# Patient Record
Sex: Male | Born: 1970 | Race: Black or African American | Hispanic: No | Marital: Single | Smoking: Current every day smoker
Health system: Southern US, Community
[De-identification: ages and names within clinical notes are randomized; demographics above are authoritative.]

## PROBLEM LIST (undated history)

## (undated) ENCOUNTER — Emergency Department (HOSPITAL_COMMUNITY): Admission: EM | Payer: 59 | Source: Home / Self Care

## (undated) DIAGNOSIS — E785 Hyperlipidemia, unspecified: Secondary | ICD-10-CM

## (undated) DIAGNOSIS — I10 Essential (primary) hypertension: Secondary | ICD-10-CM

## (undated) HISTORY — DX: Essential (primary) hypertension: I10

---

## 1999-09-26 ENCOUNTER — Emergency Department (HOSPITAL_COMMUNITY): Admission: EM | Admit: 1999-09-26 | Discharge: 1999-09-26 | Payer: Self-pay | Admitting: Emergency Medicine

## 1999-12-10 ENCOUNTER — Emergency Department (HOSPITAL_COMMUNITY): Admission: EM | Admit: 1999-12-10 | Discharge: 1999-12-10 | Payer: Self-pay | Admitting: Emergency Medicine

## 2000-03-26 ENCOUNTER — Emergency Department (HOSPITAL_COMMUNITY): Admission: EM | Admit: 2000-03-26 | Discharge: 2000-03-26 | Payer: Self-pay | Admitting: Emergency Medicine

## 2000-08-11 ENCOUNTER — Emergency Department (HOSPITAL_COMMUNITY): Admission: EM | Admit: 2000-08-11 | Discharge: 2000-08-11 | Payer: Self-pay | Admitting: Emergency Medicine

## 2000-08-11 ENCOUNTER — Encounter: Payer: Self-pay | Admitting: Emergency Medicine

## 2001-03-14 ENCOUNTER — Emergency Department (HOSPITAL_COMMUNITY): Admission: EM | Admit: 2001-03-14 | Discharge: 2001-03-14 | Payer: Self-pay | Admitting: Emergency Medicine

## 2003-10-27 ENCOUNTER — Emergency Department (HOSPITAL_COMMUNITY): Admission: EM | Admit: 2003-10-27 | Discharge: 2003-10-27 | Payer: Self-pay | Admitting: Emergency Medicine

## 2005-05-24 ENCOUNTER — Emergency Department (HOSPITAL_COMMUNITY): Admission: EM | Admit: 2005-05-24 | Discharge: 2005-05-24 | Payer: Self-pay | Admitting: Emergency Medicine

## 2008-07-24 ENCOUNTER — Ambulatory Visit: Payer: Self-pay | Admitting: *Deleted

## 2008-07-24 ENCOUNTER — Inpatient Hospital Stay (HOSPITAL_COMMUNITY): Admission: RE | Admit: 2008-07-24 | Discharge: 2008-07-28 | Payer: Self-pay | Admitting: *Deleted

## 2008-10-06 ENCOUNTER — Emergency Department (HOSPITAL_COMMUNITY): Admission: EM | Admit: 2008-10-06 | Discharge: 2008-10-06 | Payer: Self-pay | Admitting: Emergency Medicine

## 2010-05-24 ENCOUNTER — Emergency Department (HOSPITAL_COMMUNITY): Admission: EM | Admit: 2010-05-24 | Discharge: 2010-05-24 | Payer: Self-pay | Admitting: Emergency Medicine

## 2010-05-25 ENCOUNTER — Emergency Department (HOSPITAL_COMMUNITY): Admission: EM | Admit: 2010-05-25 | Discharge: 2010-05-25 | Payer: Self-pay | Admitting: Emergency Medicine

## 2011-03-08 LAB — POCT I-STAT, CHEM 8
BUN: 8 mg/dL (ref 6–23)
Creatinine, Ser: 1 mg/dL (ref 0.4–1.5)
Glucose, Bld: 92 mg/dL (ref 70–99)
Hemoglobin: 16 g/dL (ref 13.0–17.0)
Potassium: 3.5 mEq/L (ref 3.5–5.1)
TCO2: 27 mmol/L (ref 0–100)

## 2011-03-08 LAB — CBC
Hemoglobin: 14.9 g/dL (ref 13.0–17.0)
MCHC: 34 g/dL (ref 30.0–36.0)
MCV: 94 fL (ref 78.0–100.0)
RDW: 15.8 % — ABNORMAL HIGH (ref 11.5–15.5)

## 2011-03-08 LAB — DIFFERENTIAL
Basophils Absolute: 0 10*3/uL (ref 0.0–0.1)
Basophils Relative: 0 % (ref 0–1)
Eosinophils Absolute: 0.3 10*3/uL (ref 0.0–0.7)
Eosinophils Relative: 2 % (ref 0–5)
Monocytes Absolute: 1 10*3/uL (ref 0.1–1.0)
Neutro Abs: 10.9 10*3/uL — ABNORMAL HIGH (ref 1.7–7.7)

## 2011-05-04 NOTE — Discharge Summary (Signed)
[name], BUCH NO.:  [id number]   MEDICAL RECORD NO.:  [id number]          PATIENT TYPE:  IPS   LOCATION:  0604                          FACILITY:  [facility]   PHYSICIAN:  Jasmine Pang, M.D. DATE OF BIRTH:  06-06-1971   DATE OF ADMISSION:  07/24/2008  DATE OF DISCHARGE:  07/27/2008                               DISCHARGE SUMMARY   IDENTIFICATION:  This is a 40 year old single African American male who  was admitted on a voluntary basis on July 24, 2008.   HISTORY OF PRESENT ILLNESS:  The patient was referred to access at  [facility] by his employer due to having  suicidal ideation with a plan in access.  The patient planned to inject  himself with radiator fluid.  His girlfriend was staying with him today.  So, he stated he was going to follow through tomorrow.  He has been  feeling depressed for a while.  He thinks no one loves him and he is  alone.  He has five kids and he has been having car trouble, so he  cannot see his kids as much.  His children are getting older and having  less to do with the patient because of their own outside activities.  The patient is cooperative, but irritable.   PAST PSYCHIATRIC HISTORY:  None.   SUBSTANCE ABUSE HISTORY:  The patient used THC a long time ago.  He  alluded to being involved in other drugs in the past, but states he is  not now.   PAST MEDICAL HISTORY:  No medical problems.   No known drug allergies.   No current medications.   PHYSICAL FINDINGS:  Complete physical exam was done in the ED prior to  admission.  There were no acute physical or medical problems noted.   HOSPITAL COURSE:  Upon admission, the patient was started on trazodone  50 mg p.o. q.h.s. p.r.n., may repeat in x1 if needed.  He was also  started on 21 mg nicotine patch as per smoking cessation protocol.  Due  to being somewhat hypokalemic, he was started on K-Dur 40 mEq x1.  The  patient was also started on  Depakote ER 500 mg p.o. daily.  The patient  was cooperative and friendly in individual sessions with me.  He also  participated appropriately in unit therapeutic groups and activities.  He denies alcohol or drug use now.  There has been a history of this.  He states he alternated between depression and anxiety and angry  episodes.  He has a lot of crying episodes.  He states he used to self  drugs years ago.  On July 26, 2008, the patient was hyperverbal with  some pressured speech.  However, his sleep was good and appetite was  good.  He was less depressed, less anxious.  He discussed his living  situation (being alone a lot).  He states he likes his work, but the  weekends are difficult for him when he is at home alone.  He talked  about his children, whom he is  attached to.  He described a lot of  conflict with his baby's mother.  I decided to increase the Depakote  dose to 500 mg p.o. b.i.d.  The patient tolerated these medications well  with no significant side effects.  On July 27, 2008, the patient's  mental status continued to improve.  He was less depressed, less  anxious.  He began to talk about going home on Sunday, July 28, 2008.  He was looking forward to this.  The patient was less hyperverbal with  no circumstantial thinking.  On July 28, 2008, mental status had  improved markedly from admission status.  The patient was less  depressed, less irritable, less anxious.  Affect was consistent with  mood.  There was no suicidal or homicidal ideation.  No thoughts of self-  injurious behavior.  No auditory or visual hallucinations.  No paranoia  or delusions.  Thoughts were logical and goal-directed.  Thought  content.  No predominant theme.  Cognitive was grossly intact.  Insight  good, judgment good.  Impulse level was normal, and it was felt the  patient was safe for discharge today.   DISCHARGE DIAGNOSES:  Axis I:  Mood disorder, not otherwise specified.  Features of  substance abuse, NOS in the past.  Axis II:  None.  Axis III:  None.  Axis IV:  Severe (problems with primary support group, financial  problems, worries about caring for his children and being able to see  them).  Axis V:  Global assessment of functioning was 50 upon discharge.  GAF  was 35 upon admission.  GAF highest past year was 70.   DISCHARGE PLAN:  There are no specific activity level or dietary  restrictions.   POSTHOSPITAL CARE PLANS:  The patient will go to the [facility] on  August 01, 2008 at 9:30 a.m.  He will also go to [facility] for  counseling.   DISCHARGE MEDICATIONS:  1. Depakote ER 500 mg b.i.d.  2. Trazodone 50 mg at bedtime if needed.      Jasmine Pang, M.D.  Electronically Signed     [facility]MED[facility]  D:  07/28/2008  T:  07/29/2008  Job:  [id number]

## 2011-06-24 ENCOUNTER — Emergency Department (HOSPITAL_COMMUNITY)
Admission: EM | Admit: 2011-06-24 | Discharge: 2011-06-24 | Disposition: A | Discharge status: Home / Self Care | Payer: Self-pay | Attending: Emergency Medicine | Admitting: Emergency Medicine

## 2011-06-24 DIAGNOSIS — R42 Dizziness and giddiness: Secondary | ICD-10-CM | POA: Insufficient documentation

## 2011-06-24 DIAGNOSIS — R51 Headache: Secondary | ICD-10-CM | POA: Insufficient documentation

## 2011-09-17 LAB — BASIC METABOLIC PANEL
BUN: 8
CO2: 27
Chloride: 105
Creatinine, Ser: 0.84
Potassium: 3.1 — ABNORMAL LOW

## 2011-09-17 LAB — URINE DRUGS OF ABUSE SCREEN W ALC, ROUTINE (REF LAB)
Amphetamine Screen, Ur: NEGATIVE
Barbiturate Quant, Ur: NEGATIVE
Benzodiazepines.: NEGATIVE
Ethyl Alcohol: <10
Methadone: NEGATIVE
Phencyclidine (PCP): NEGATIVE

## 2011-09-17 LAB — COMPREHENSIVE METABOLIC PANEL
ALT: 17
AST: 21
Albumin: 4.1
Alkaline Phosphatase: 59
Calcium: 9.8
GFR calc Af Amer: >60
Potassium: 2.9 — ABNORMAL LOW
Sodium: 141
Total Protein: 7.9

## 2011-09-17 LAB — URINALYSIS, ROUTINE W REFLEX MICROSCOPIC
Bilirubin Urine: NEGATIVE
Glucose, UA: NEGATIVE
Ketones, ur: NEGATIVE
Nitrite: NEGATIVE
Specific Gravity, Urine: 1.03
pH: 6.5

## 2011-09-17 LAB — HEPATIC FUNCTION PANEL
AST: 18
Albumin: 3.5
Total Bilirubin: 0.7
Total Protein: 7

## 2011-09-17 LAB — CBC
Hemoglobin: 14.7
MCHC: 33.7
MCHC: 34.1
Platelets: 201
Platelets: 219
RDW: 14.8
RDW: 15.8 — ABNORMAL HIGH

## 2011-09-17 LAB — DIFFERENTIAL
Basophils Absolute: 0.1
Basophils Relative: 1
Lymphocytes Relative: 44
Neutro Abs: 3
Neutrophils Relative %: 40 — ABNORMAL LOW

## 2011-09-17 LAB — URINE MICROSCOPIC-ADD ON

## 2011-09-17 LAB — VALPROIC ACID LEVEL: Valproic Acid Lvl: 59.8

## 2011-09-17 LAB — THC (MARIJUANA), URINE, CONFIRMATION: Marijuana, Ur-Confirmation: 150 ng/mL

## 2014-01-29 ENCOUNTER — Encounter (HOSPITAL_COMMUNITY): Payer: Self-pay | Admitting: Emergency Medicine

## 2014-01-29 ENCOUNTER — Emergency Department (HOSPITAL_COMMUNITY): Payer: 59

## 2014-01-29 ENCOUNTER — Emergency Department (HOSPITAL_COMMUNITY)
Admission: EM | Admit: 2014-01-29 | Discharge: 2014-01-29 | Disposition: A | Discharge status: Home / Self Care | Payer: 59 | Attending: Emergency Medicine | Admitting: Emergency Medicine

## 2014-01-29 DIAGNOSIS — Z87828 Personal history of other (healed) physical injury and trauma: Secondary | ICD-10-CM | POA: Insufficient documentation

## 2014-01-29 DIAGNOSIS — R61 Generalized hyperhidrosis: Secondary | ICD-10-CM | POA: Insufficient documentation

## 2014-01-29 DIAGNOSIS — R0602 Shortness of breath: Secondary | ICD-10-CM | POA: Insufficient documentation

## 2014-01-29 DIAGNOSIS — H81399 Other peripheral vertigo, unspecified ear: Secondary | ICD-10-CM | POA: Insufficient documentation

## 2014-01-29 DIAGNOSIS — F17200 Nicotine dependence, unspecified, uncomplicated: Secondary | ICD-10-CM | POA: Insufficient documentation

## 2014-01-29 DIAGNOSIS — R079 Chest pain, unspecified: Secondary | ICD-10-CM | POA: Insufficient documentation

## 2014-01-29 LAB — BASIC METABOLIC PANEL
BUN: 11 mg/dL (ref 6–23)
CALCIUM: 9.7 mg/dL (ref 8.4–10.5)
CO2: 25 mEq/L (ref 19–32)
Chloride: 102 mEq/L (ref 96–112)
Creatinine, Ser: 0.82 mg/dL (ref 0.50–1.35)
Glucose, Bld: 89 mg/dL (ref 70–99)
POTASSIUM: 4 meq/L (ref 3.7–5.3)
SODIUM: 140 meq/L (ref 137–147)

## 2014-01-29 LAB — CBC
HCT: 42.8 % (ref 39.0–52.0)
Hemoglobin: 15 g/dL (ref 13.0–17.0)
MCH: 31.9 pg (ref 26.0–34.0)
MCHC: 35 g/dL (ref 30.0–36.0)
MCV: 91.1 fL (ref 78.0–100.0)
Platelets: 204 10*3/uL (ref 150–400)
RBC: 4.7 MIL/uL (ref 4.22–5.81)
RDW: 15.9 % — AB (ref 11.5–15.5)
WBC: 11.9 10*3/uL — ABNORMAL HIGH (ref 4.0–10.5)

## 2014-01-29 LAB — POCT I-STAT TROPONIN I: Troponin i, poc: 0 ng/mL (ref 0.00–0.08)

## 2014-01-29 MED ORDER — SODIUM CHLORIDE 0.9 % IV BOLUS (SEPSIS)
1000.0000 mL | Freq: Once | INTRAVENOUS | Status: AC
  Administered 2014-01-29: 1000 mL via INTRAVENOUS

## 2014-01-29 MED ORDER — MECLIZINE HCL 50 MG PO TABS: 50.0000 mg | ORAL_TABLET | Freq: Three times a day (TID) | ORAL | Status: DC | PRN

## 2014-01-29 MED ORDER — PROMETHAZINE HCL 25 MG PO TABS: 25.0000 mg | ORAL_TABLET | Freq: Four times a day (QID) | ORAL | Status: DC | PRN
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
## 2014-01-29 NOTE — Discharge Instructions (Signed)
Chest Pain (Nonspecific) °It is often hard to give a specific diagnosis for the cause of chest pain. There is always a chance that your pain could be related to something serious, such as a heart attack or a blood clot in the lungs. You need to follow up with your caregiver for further evaluation. °CAUSES  °· Heartburn. °· Pneumonia or bronchitis. °· Anxiety or stress. °· Inflammation around your heart (pericarditis) or lung (pleuritis or pleurisy). °· A blood clot in the lung. °· A collapsed lung (pneumothorax). It can develop suddenly on its own (spontaneous pneumothorax) or from injury (trauma) to the chest. °· Shingles infection (herpes zoster virus). °The chest wall is composed of bones, muscles, and cartilage. Any of these can be the source of the pain. °· The bones can be bruised by injury. °· The muscles or cartilage can be strained by coughing or overwork. °· The cartilage can be affected by inflammation and become sore (costochondritis). °DIAGNOSIS  °Lab tests or other studies, such as X-rays, electrocardiography, stress testing, or cardiac imaging, may be needed to find the cause of your pain.  °TREATMENT  °· Treatment depends on what may be causing your chest pain. Treatment may include: °· Acid blockers for heartburn. °· Anti-inflammatory medicine. °· Pain medicine for inflammatory conditions. °· Antibiotics if an infection is present. °· You may be advised to change lifestyle habits. This includes stopping smoking and avoiding alcohol, caffeine, and chocolate. °· You may be advised to keep your head raised (elevated) when sleeping. This reduces the chance of acid going backward from your stomach into your esophagus. °· Most of the time, nonspecific chest pain will improve within 2 to 3 days with rest and mild pain medicine. °HOME CARE INSTRUCTIONS  °· If antibiotics were prescribed, take your antibiotics as directed. Finish them even if you start to feel better. °· For the next few days, avoid physical  activities that bring on chest pain. Continue physical activities as directed. °· Do not smoke. °· Avoid drinking alcohol. °· Only take over-the-counter or prescription medicine for pain, discomfort, or fever as directed by your caregiver. °· Follow your caregiver's suggestions for further testing if your chest pain does not go away. °· Keep any follow-up appointments you made. If you do not go to an appointment, you could develop lasting (chronic) problems with pain. If there is any problem keeping an appointment, you must call to reschedule. °SEEK MEDICAL CARE IF:  °· You think you are having problems from the medicine you are taking. Read your medicine instructions carefully. °· Your chest pain does not go away, even after treatment. °· You develop a rash with blisters on your chest. °SEEK IMMEDIATE MEDICAL CARE IF:  °· You have increased chest pain or pain that spreads to your arm, neck, jaw, back, or abdomen. °· You develop shortness of breath, an increasing cough, or you are coughing up blood. °· You have severe back or abdominal pain, feel nauseous, or vomit. °· You develop severe weakness, fainting, or chills. °· You have a fever. °THIS IS AN EMERGENCY. Do not wait to see if the pain will go away. Get medical help at once. Call your local emergency services (911 in [place] Do not drive yourself to the hospital. °MAKE SURE YOU:  °· Understand these instructions. °· Will watch your condition. °· Will get help right away if you are not doing well or get worse. °Document Released: 09/15/2005 Document Revised: 02/28/2012 Document Reviewed: 07/11/2008 °ExitCare® Patient Information ©2014 ExitCare,   LLC.  Benign Positional Vertigo Vertigo means you feel like you or your surroundings are moving when they are not. Benign positional vertigo is the most common form of vertigo. Benign means that the cause of your condition is not serious. Benign positional vertigo is more common in older adults. CAUSES  Benign  positional vertigo is the result of an upset in the labyrinth system. This is an area in the middle ear that helps control your balance. This may be caused by a viral infection, head injury, or repetitive motion. However, often no specific cause is found. SYMPTOMS  Symptoms of benign positional vertigo occur when you move your head or eyes in different directions. Some of the symptoms may include:  Loss of balance and falls.  Vomiting.  Blurred vision.  Dizziness.  Nausea.  Involuntary eye movements (nystagmus). DIAGNOSIS  Benign positional vertigo is usually diagnosed by physical exam. If the specific cause of your benign positional vertigo is unknown, your caregiver may perform imaging tests, such as magnetic resonance imaging (MRI) or computed tomography (CT). TREATMENT  Your caregiver may recommend movements or procedures to correct the benign positional vertigo. Medicines such as meclizine, benzodiazepines, and medicines for nausea may be used to treat your symptoms. In rare cases, if your symptoms are caused by certain conditions that affect the inner ear, you may need surgery. HOME CARE INSTRUCTIONS   Follow your caregiver's instructions.  Move slowly. Do not make sudden body or head movements.  Avoid driving.  Avoid operating heavy machinery.  Avoid performing any tasks that would be dangerous to you or others during a vertigo episode.  Drink enough fluids to keep your urine clear or pale yellow. SEEK IMMEDIATE MEDICAL CARE IF:   You develop problems with walking, weakness, numbness, or using your arms, hands, or legs.  You have difficulty speaking.  You develop severe headaches.  Your nausea or vomiting continues or gets worse.  You develop visual changes.  Your family or friends notice any behavioral changes.  Your condition gets worse.  You have a fever.  You develop a stiff neck or sensitivity to light. MAKE SURE YOU:   Understand these  instructions.  Will watch your condition.  Will get help right away if you are not doing well or get worse. Document Released: 09/13/2006 Document Revised: 02/28/2012 Document Reviewed: 08/26/2011 [facility] Patient Information 2014 [place], [place].    Emergency Department Resource Guide 1) Find a Doctor and Pay Out of Pocket Although you won't have to find out who is covered by your insurance plan, it is a good idea to ask around and get recommendations. You will then need to call the office and see if the doctor you have chosen will accept you as a new patient and what types of options they offer for patients who are self-pay. Some doctors offer discounts or will set up payment plans for their patients who do not have insurance, but you will need to ask so you aren't surprised when you get to your appointment.  2) Contact Your Local Health Department Not all health departments have doctors that can see patients for sick visits, but many do, so it is worth a call to see if yours does. If you don't know where your local health department is, you can check in your phone book. The CDC also has a tool to help you locate your state's health department, and many state websites also have listings of all of their local health departments.  3) Find a  Walk-in Clinic If your illness is not likely to be very severe or complicated, you may want to try a walk in clinic. These are popping up all over the country in pharmacies, drugstores, and shopping centers. They're usually staffed by nurse practitioners or physician assistants that have been trained to treat common illnesses and complaints. They're usually fairly quick and inexpensive. However, if you have serious medical issues or chronic medical problems, these are probably not your best option.  No Primary Care Doctor: - Call Health Connect at  [phone] - they can help you locate a primary care doctor that  accepts your insurance, provides certain  services, etc. - Physician Referral Service- [phone]  Chronic Pain Problems: Organization         Address  Phone   Notes  [facility]  [phone] Patients need to be referred by their primary care doctor.   Medication Assistance: Organization         Address  Phone   Notes  [facility] Medication [facility] [address] [place] [place], [place] [address] [phone] --Must be a resident of [facility] -- Must have NO insurance coverage whatsoever (no Medicaid/ Medicare, etc.) -- The pt. MUST have a primary care doctor that directs their care regularly and follows them in the community   MedAssist  [phone]   [employer]  [phone]    Agencies that provide inexpensive medical care: Organization         Address  Phone   Notes  [facility]  [phone]   [facility]    [phone]   [facility] [address] [place], [place] [address] [phone]   [facility] [address] [place] [place] [phone]   Planned Parenthood    [phone]   [facility]    [phone]   [facility]  [address], [place] Phone:  [phone], Fax:  [phone] Hours of Operation:  9 am - 6 pm, M-F.  Also accepts Medicaid/Medicare and self-pay.  [facility]  [address], [place] Phone: [phone], Fax: [phone]. Hours of Operation:  8:30 am - 5:30 pm, M-F.  Also accepts Medicaid and self-pay.  [facility] [place] [address] [place] Phone: [phone]   [facility] [address] Natasha Bence [place], [place] [phone], Ext. [phone] Mondays & Thursdays: 7-9 AM.  First 15 patients are seen on a first come, first serve basis.    Medicaid-accepting [facility] Providers:  Organization         Address  Phone   Notes  [facility] [address],  [phone] Also accepts self-pay patients.  [facility] [address] Laurell Josephs [place], [place]  [phone]   [facility] [address], [place] [phone]   [facility] [address] [place] [phone]   Renaye Rakers [address], [place]   [phone] Only accepts [place] Access [place] patients after they have their name applied to their card.   Self-Pay (no insurance) in [facility]:  Organization         Address  Phone   Notes  Sickle Cell Patients, [facility] [address] [place],  [place] [phone]   [facility] [address] [place], [place] [phone]   [facility] [place]  [address], [place] [phone]   [facility]/Dr. Osei-Bonsu  [address], [place] or [address], [place] [phone] Phone number for both [place] and [place] locations is the same.  [facility] [address] [place] [phone]   [facility] [address], [place] or [address] [phone]   [facility] [address], [place] [phone], phone; [phone], fax Sees patients 1st and 3rd Saturday of every month.  Must not qualify for public or private insurance (i.e. Medicaid, Medicare, [place] Health Choice, Veterans' Benefits)  Household income should be no more than 200% of the poverty level The clinic cannot treat you if you are pregnant or think you are pregnant  Sexually transmitted diseases are not treated at the clinic.    Dental Care: Organization         Address  Phone  Notes  [facility] [address] [place], [place] [phone] Accepts  children up to age 52 who are enrolled in [place] or [place] Health Choice; pregnant women with a Medicaid card; and children who have applied for Medicaid or [place] Health Choice, but were declined, whose parents can pay a reduced fee at time of service.  [facility]  [address], [place] [phone] Accepts children up to age 36 who are enrolled in [place] or [place] Health Choice; pregnant women with a Medicaid card; and children who have applied for Medicaid or [place] Health Choice, but were declined, whose parents can pay a reduced fee at time of service.  [facility]  [address] [place], [place] [phone] Patients are seen by appointment only. Walk-ins are not accepted. [facility] will see patients 37 years of age and older. Monday - Tuesday (8am-5pm) Most Wednesdays (8:30-5pm) $30 per visit, cash only  [facility] Adult Dental Access PROGRAM  [address], [facility] [phone] Patients are seen by appointment only. Walk-ins are not accepted. [facility] will see patients 78 years of age and older. One Wednesday Evening (Monthly: Volunteer Based).  $30 per visit, cash only  [employer]  [phone] for adults; Children under age 14, call Graduate Pediatric Dentistry at [phone]. Children aged 75-14, please call [phone] to request a pediatric application.  Dental services are provided in all areas of dental care including fillings, crowns and bridges, complete and partial dentures, implants, gum treatment, root canals, and extractions. Preventive care is also provided. Treatment is provided to both adults and children. Patients are selected via a lottery and there is often a waiting list.   [facility] [address] [place]  [phone] [email or web address]   [facility] [address] [employer], [place] [phone], Ext. [phone] Second and  Fourth Thursday of each month, opens at 6:30 AM; Clinic ends at 9 AM.  Patients are seen on a first-come first-served basis, and a limited number are seen during each clinic.   [facility]  [address] Ether Griffins [place], [place] [phone]   Eligibility Requirements You must have lived in [place], [place], or [place]  counties for at least the last three months.   You cannot be eligible for state or federal sponsored [place], including [employer], [place], or [employer].   You generally cannot be eligible for healthcare insurance through your employer.    How to apply: Eligibility screenings are held every Tuesday and Wednesday afternoon from 1:00 pm until 4:00 pm. You do not need an appointment for the interview!  [facility] [address], [place], [place] [phone]   [facility] Health Department  [phone]   [facility] Health Department  [phone]   [facility] Health Department  [phone]    Behavioral Health Resources in the Community: Intensive Outpatient Programs Organization         Address  Phone  Notes  [facility] [address], [place], [place] [phone]   [facility] [address], [place], [place] [phone]   [facility] [address] [place], [place]  [phone]   [facility] [address],  [place], [place] [phone] or [phone]   Substance Abuse Resources Organization         Address  Phone  Notes  [facility] Drug Services  [phone]   [facility]  [phone]   [facility] [place]  [phone]   Floydene Flock  [phone]   Residential & Outpatient Substance Abuse Program  [phone]   Psychological Services Organization         Address  Phone  Notes  [facility] Behavioral Health  [phone]   [facility] Services  [phone]   [facility] Mental Health  [address] [place] [phone] or [phone]    Mobile Crisis Teams Organization         Address  Phone  Notes  [facility], Mobile Crisis Care Unit  [phone]   [facility]  [address] [place], [place] [phone]   Doristine Locks [address] [place] [phone]    Self-Help/Support Groups Organization         Address  Phone             Notes  [facility] [place] - variety of support groups  336- I7437963 Call for more information  Narcotics Anonymous (NA), [facility] [address], [employer] [place]  2 meetings at this location   Statistician         Address  Phone  Notes  [facility] [address],    [place]  [phone]   [facility] [place] [facility]  [address], [place] [address], [place], [place] [phone]   [facility] [address] [place], [place] [phone] Admissions: 8am-3pm M-F  [facility] [address],    [place], [place] [phone]   [facility] [address] [place], [place], [place] [phone]   [facility] [address]  [place], [place] [phone]   [facility] - Intensive Outpatient [address] Laurell Joseph[address], [place], [place] [phone]   [facility] ([facility]iction [facility] [address] [place]  [place], [place] [phone] or [phone]   [facility] ([facility]) [address] [place], [place] [phone] Accepts Medicaid  [facility] [address],  [place] [phone] Substance Abuse/Addiction Treatment   [facility] Resources Organization         Address  Phone  Notes  Estate manager/land agent Services  (  [phone]) [phone]   Angie FavaJulie Brannon, PhD [address], [address] [place], [place]   [phone]) [phone] or [phone]) [phone]   [facility]   [address] [place], [place] [phone])  [phone]   [facility] [address], [place], [place] [phone]) [phone] Insurance/Medicaid/sponsorship through [facility]  [facility] [address]                                    [place], [place] [phone]) [phone] Therapy/tele-psych/case  [facility] [address]   [place], [place] [phone]) [phone]    Dr. Lolly MustacheArfeen  [phone]   [facility] [place] [facility]  [employer] [facility] 1) [address], [place] 2) [address], [place] 3)  [address], [place] [phone]) [phone]) [phone]  [phone]) [phone]   [facility] Child Abuse Hotline [phone](336) 2390647985 or [phone](336) [phone] (After Hours)

## 2014-01-29 NOTE — Progress Notes (Signed)
   CARE MANAGEMENT ED NOTE 01/29/2014  [facility]  [name]   Account Number:  [id number]  Date Initiated:  01/29/2014  Documentation initiated by:  Radford PaxFERRERO,Syrita Dovel  Subjective/Objective Assessment:   Patient presents to Ed with intermittent dizziness x 2.5 weeks.     Subjective/Objective Assessment Detail:     Action/Plan:   Action/Plan Detail:   Anticipated DC Date:       Status Recommendation to Physician:   Result of Recommendation:    Other ED Services  Consult Working Plan    DC Planning Services  Other  PCP issues    Choice offered to / List presented to:            Status of service:  Completed, signed off  ED Comments:   ED Comments Detail:  Patient confirms he does not have a pcp.  EDCM instructed patient to call the phone number on the back of his insurance card or go to insurance company website to help him find a physician who is close to him and within network.  Patient verbalized understanding.  no further EDCM needs at this time.

## 2015-09-16 ENCOUNTER — Emergency Department (HOSPITAL_COMMUNITY)
Admission: EM | Admit: 2015-09-16 | Discharge: 2015-09-16 | Disposition: A | Discharge status: Home / Self Care | Payer: 59 | Attending: Emergency Medicine | Admitting: Emergency Medicine

## 2015-09-16 ENCOUNTER — Encounter (HOSPITAL_COMMUNITY): Payer: Self-pay | Admitting: Emergency Medicine

## 2015-09-16 DIAGNOSIS — I10 Essential (primary) hypertension: Secondary | ICD-10-CM | POA: Insufficient documentation

## 2015-09-16 DIAGNOSIS — Z720 Tobacco use: Secondary | ICD-10-CM | POA: Insufficient documentation

## 2015-09-16 DIAGNOSIS — K088 Other specified disorders of teeth and supporting structures: Secondary | ICD-10-CM | POA: Diagnosis present

## 2015-09-16 DIAGNOSIS — K047 Periapical abscess without sinus: Secondary | ICD-10-CM | POA: Diagnosis not present

## 2015-09-16 MED ORDER — PENICILLIN V POTASSIUM 500 MG PO TABS
500.0000 mg | ORAL_TABLET | Freq: Once | ORAL | Status: AC
  Administered 2015-09-16: 500 mg via ORAL
  Filled 2015-09-16: qty 1

## 2015-09-16 MED ORDER — IBUPROFEN 800 MG PO TABS
800.0000 mg | ORAL_TABLET | Freq: Once | ORAL | Status: AC
  Administered 2015-09-16: 800 mg via ORAL
  Filled 2015-09-16: qty 1

## 2015-09-16 MED ORDER — IBUPROFEN 800 MG PO TABS: 800.0000 mg | ORAL_TABLET | Freq: Three times a day (TID) | ORAL | Status: DC

## 2015-09-16 MED ORDER — PENICILLIN V POTASSIUM 500 MG PO TABS: 500.0000 mg | ORAL_TABLET | Freq: Four times a day (QID) | ORAL | Status: AC

## 2015-09-16 NOTE — ED Notes (Signed)
PA, Patel back in to speak to pt.  RN again encouraged pt to utilize list of providers given today.  Pt verbalized understanding.

## 2015-09-16 NOTE — ED Notes (Signed)
Painful lump in tissue of front gum line. Pain and swelling noted this am

## 2015-09-16 NOTE — ED Provider Notes (Signed)
CSN: [id number]     Arrival date & time 09/16/15  1301 History  By signing my name below, I, [name], attest that this documentation has been prepared under the direction and in the presence of [employer], PA-C. Electronically Signed: Ronney Allison, ED Scribe. 09/16/2015. 1:31 PM.    Chief Complaint  Patient presents with  . Dental Pain    pain in front gum   The history is provided by the patient. No language interpreter was used.    HPI Comments: [name] is a 44 y.o. male who presents to the Emergency Department complaining of constant, throbbing, severe, front lower gingival pain radiating to his head. He states he first noticed a painful bump on the interior gumline behind his front lower teeth this morning. Patient reports he has not taken any medications or tried any treatments for this.  He denies fever, chills, or facial swelling.   History reviewed. No pertinent past medical history. History reviewed. No pertinent past surgical history. Family History  Problem Relation Age of Onset  . Cancer Mother   . Heart failure Father    Social History  Substance Use Topics  . Smoking status: Current Every Day Smoker -- 0.50 packs/day    Types: Cigarettes  . Smokeless tobacco: None  . Alcohol Use: No    Review of Systems  Constitutional: Negative for fever and chills.  HENT: Positive for dental problem. Negative for facial swelling.   Neurological: Positive for headaches (radiating from dental pain).    Allergies  Review of patient's allergies indicates no known allergies.  Home Medications   Prior to Admission medications   Medication Sig Start Date End Date Taking? Authorizing Provider  ibuprofen (ADVIL,MOTRIN) 800 MG tablet Take 1 tablet (800 mg total) by mouth 3 (three) times daily. 09/16/15   Hanna Patel-Mills, PA-C  meclizine (ANTIVERT) 50 MG tablet Take 1 tablet (50 mg total) by mouth 3 (three) times daily as needed for dizziness. 01/29/14   Kristen N Ward, DO   penicillin v potassium (VEETID) 500 MG tablet Take 1 tablet (500 mg total) by mouth 4 (four) times daily. 09/16/15 09/23/15  Hanna Patel-Mills, PA-C  promethazine (PHENERGAN) 25 MG tablet Take 1 tablet (25 mg total) by mouth every 6 (six) hours as needed for nausea or vomiting. 01/29/14   Kristen N Ward, DO   BP 174/85 mmHg  Pulse 70  Temp(Src) 99.6 F (37.6 C) (Oral)  Resp 18  SpO2 100% Physical Exam  Constitutional: He is oriented to person, place, and time. He appears well-developed and well-nourished. No distress.  HENT:  Head: Normocephalic and atraumatic.  He has a dental abscess below the front lower teeth, along the interior gumline, without active drainage. He has plaque buildup, multiple dental caries, and poor dentition throughout. No facial swelling or tongue swelling. No drooling or trismus. Patent airway.   Eyes: Conjunctivae and EOM are normal.  Neck: Neck supple. No tracheal deviation present.  No anterior cervical swelling.   Cardiovascular: Normal rate.   Pulmonary/Chest: Effort normal. No respiratory distress.  Musculoskeletal: Normal range of motion.  Lymphadenopathy:    He has no cervical adenopathy.  Neurological: He is alert and oriented to person, place, and time.  Skin: Skin is warm and dry.  Psychiatric: He has a normal mood and affect. His behavior is normal.  Nursing note and vitals reviewed.   ED Course  Procedures (including critical care time)  DIAGNOSTIC STUDIES: Oxygen Saturation is 97% on RA, normal by my  interpretation.    COORDINATION OF CARE: 1:26 PM - Discussed treatment plan with pt at bedside which includes aspiration of abscess and Rx antibiotics. Pt verbalized understanding and agreed to plan.   ASPIRATION PROCEDURE NOTE: Patient identification was confirmed and verbal consent was obtained. This procedure was performed by Catha Gosselin, PA-C, at 1:43 PM. Site: Interior gumline along front lower teeth Sterile procedures  observed Needle size: 27 Drainage: No drainage was able to be aspirated. Pt tolerated procedure well without complications.  Instructions for care discussed verbally and pt provided with additional written instructions for homecare and f/u.   MDM   Final diagnoses:  Dental abscess  Essential hypertension  Patient presents for dental abscess. He is afebrile and vitals are stable. I attempted to aspirate the abscess but did not obtain anything. He does not appear to have Ludwig's angina or deep tissue infection. I discussed return precautions with the patient. I gave him several dental referrals. Patient is hypertensive upon discharge but states his pain is only 2/10 now. I discussed following up with a primary care provider using the resource guide and he verbally agrees with the plan I also gave him return precautions. Medications  penicillin v potassium (VEETID) tablet 500 mg (500 mg Oral Given 09/16/15 1424)  ibuprofen (ADVIL,MOTRIN) tablet 800 mg (800 mg Oral Given 09/16/15 1424)  Rx: PCN V I personally performed the services described in this documentation, which was scribed in my presence. The recorded information has been reviewed and is accurate.     Catha Gosselin, PA-C 09/16/15 1631  Pricilla Loveless, MD 09/18/15 [phone]

## 2015-09-16 NOTE — Discharge Instructions (Signed)
Dental Abscess Follow-up with a dentist from the referrals that I have given you today or use the resource guide below. Return for fever, increased gum swelling, tongue swelling, or difficulty breathing. A dental abscess is a collection of infected fluid (pus) from a bacterial infection in the inner part of the tooth (pulp). It usually occurs at the end of the tooth's root.  CAUSES   Severe tooth decay.  Trauma to the tooth that allows bacteria to enter into the pulp, such as a broken or chipped tooth. SYMPTOMS   Severe pain in and around the infected tooth.  Swelling and redness around the abscessed tooth or in the mouth or face.  Tenderness.  Pus drainage.  Bad breath.  Bitter taste in the mouth.  Difficulty swallowing.  Difficulty opening the mouth.  Nausea.  Vomiting.  Chills.  Swollen neck glands. DIAGNOSIS   A medical and dental history will be taken.  An examination will be performed by tapping on the abscessed tooth.  X-rays may be taken of the tooth to identify the abscess. TREATMENT The goal of treatment is to eliminate the infection. You may be prescribed antibiotic medicine to stop the infection from spreading. A root canal may be performed to save the tooth. If the tooth cannot be saved, it may be pulled (extracted) and the abscess may be drained.  HOME CARE INSTRUCTIONS  Only take over-the-counter or prescription medicines for pain, fever, or discomfort as directed by your caregiver.  Rinse your mouth (gargle) often with salt water ( tsp salt in 8 oz [250 ml] of warm water) to relieve pain or swelling.  Do not drive after taking pain medicine (narcotics).  Do not apply heat to the outside of your face.  Return to your dentist for further treatment as directed. SEEK MEDICAL CARE IF:  Your pain is not helped by medicine.  Your pain is getting worse instead of better. SEEK IMMEDIATE MEDICAL CARE IF:  You have a fever or persistent symptoms for  more than 2-3 days.  You have a fever and your symptoms suddenly get worse.  You have chills or a very bad headache.  You have problems breathing or swallowing.  You have trouble opening your mouth.  You have swelling in the neck or around the eye. Document Released: 12/06/2005 Document Revised: 08/30/2012 Document Reviewed: 03/16/2011 [facility] Patient Information 2015 [place], [place]. This information is not intended to replace advice given to you by your health care provider. Make sure you discuss any questions you have with your health care provider.  Emergency Department Resource Guide 1) Find a Doctor and Pay Out of Pocket Although you won't have to find out who is covered by your insurance plan, it is a good idea to ask around and get recommendations. You will then need to call the office and see if the doctor you have chosen will accept you as a new patient and what types of options they offer for patients who are self-pay. Some doctors offer discounts or will set up payment plans for their patients who do not have insurance, but you will need to ask so you aren't surprised when you get to your appointment.  2) Contact Your Local Health Department Not all health departments have doctors that can see patients for sick visits, but many do, so it is worth a call to see if yours does. If you don't know where your local health department is, you can check in your phone book. The CDC also has  a tool to help you locate your state's health department, and many state websites also have listings of all of their local health departments.  3) Find a Walk-in Clinic If your illness is not likely to be very severe or complicated, you may want to try a walk in clinic. These are popping up all over the country in pharmacies, drugstores, and shopping centers. They're usually staffed by nurse practitioners or physician assistants that have been trained to treat common illnesses and complaints. They're  usually fairly quick and inexpensive. However, if you have serious medical issues or chronic medical problems, these are probably not your best option.  No Primary Care Doctor: - Call Health Connect at  [phone] - they can help you locate a primary care doctor that  accepts your insurance, provides certain services, etc. - Physician Referral Service- [phone]  Chronic Pain Problems: Organization         Address  Phone   Notes  [facility]  [phone] Patients need to be referred by their primary care doctor.   Medication Assistance: Organization         Address  Phone   Notes  [facility] Medication [facility] [address] [place], [place] [address] [phone] --Must be a resident of [facility] -- Must have NO insurance coverage whatsoever (no Medicaid/ Medicare, etc.) -- The pt. MUST have a primary care doctor that directs their care regularly and follows them in the community   Med[facility]ist  [phone]   [employer]  [phone]    Agencies that provide inexpensive medical care: Organization         Address  Phone   Notes  [facility]  [phone]   [facility]    [phone]   [facility] [address] [place], [place] [address] [phone]   [facility] [address] [place], [place] [phone]   [facility]anned Parenthood    [phone]   [facility]    [phone]   [facility]  [address], [place] Phone:  [phone], Fax:  [phone] Hours of Operation:  9 am - 6 pm, M-F.  Also accepts Medicaid/Medicare and self-pay.  [facility]  [address], [place] Phone: [phone], Fax: [phone]. Hours of Operation:  8:30 am - 5:30 pm, M-F.  Also accepts Medicaid and self-pay.  [facility] [place] [address], [place] Phone:  [phone]   [facility] [address] Natasha Bence [place], [place] [phone], Ext. [phone] Mondays & Thursdays: 7-9 AM.  First 15 patients are seen on a first come, first serve basis.    Medicaid-accepting [facility] Providers:  Organization         Address  Phone   Notes  [facility] [address], [place] [phone] Also accepts self-pay patients.  [facility] [address] Laurell Josephs [place], [place]  [phone]   [facility] [address], [place] [phone]   [facility] [address] [place] [phone]   Renaye Rakers [address], [place]   [phone] Only accepts [place] Access [place] patients after they have their name applied to their card.   Self-Pay (no insurance) in [place]:  Organization         Address  Phone   Notes  Sickle Cell Patients, [facility] [address] [place], [place] [phone]   [facility] [address] [place], [place] [phone]   [facility] [place]  [address], [place] [phone]   [facility]/Dr. Osei-Bonsu  [address], [place] or [address], [place] [phone] Phone number for both [place] and [place] locations is the same.  [facility] [address] [place] [phone]   [facility] [address], [place] or [address] [phone]   [facility] [address] [place] [phone], phone; [phone], fax Sees patients 1st and 3rd Saturday of every month.  Must not qualify for public or private insurance (i.e. Medicaid, Medicare, [place] Health Choice, Veterans' Benefits)  Household income should be no more than 200% of the poverty level The clinic cannot treat  you if you are pregnant or think you are pregnant  Sexually transmitted diseases are not treated at the clinic.    Dental Care: Organization         Address  Phone  Notes  [facility] Department of [facility] [address] [place], [place] [phone] Accepts children up to age 65 who are enrolled in [place] or [place] Health Choice; pregnant women with a Medicaid card; and children who have applied for Medicaid or [place] Health Choice, but were declined, whose parents can pay a reduced fee at time of service.  [facility] Department of [facility]  [address], [place] [phone] Accepts children up to age 76 who are enrolled in [place] or [place] Health Choice; pregnant women with a Medicaid card; and children who have applied for Medicaid or [place] Health Choice, but were declined, whose parents can pay a reduced fee at time of service.  [facility]  [address] [place], [place] [phone] Patients are seen by appointment only. Walk-ins are not accepted. [facility] will see patients 15 years of age and older. Monday - Tuesday (8am-5pm) Most Wednesdays (8:30-5pm) $30 per visit, cash only  [facility] Adult Dental Access PRO[facility]  [address], [facility] [phone] Patients are seen by appointment only. Walk-ins are not accepted. [facility] will see patients 24 years of age and older. One Wednesday Evening (Monthly: Volunteer Based).  $30 per visit, cash only  [employer]  [phone] for adults; Children under age 49, call Graduate Pediatric Dentistry at [phone]. Children aged 26-14, please call [phone] to request a pediatric application.  Dental services are provided in all areas of dental care including fillings, crowns and bridges, complete and partial dentures, implants, gum treatment, root canals, and extractions. Preventive care is also provided. Treatment  is provided to both adults and children. Patients are selected via a lottery and there is often a waiting list.   [facility] [address], [place]  [phone] [email or web address]   [facility] [address] [place], [place] [phone], Ext. [phone] Second and Fourth Thursday of each month, opens at 6:30 AM; Clinic ends at 9 AM.  Patients are seen on a first-come first-served basis, and a limited number are seen during each clinic.  [facility]  [address] Ether [place], [place] [phone]   Eligibility Requirements You must have lived in [place], [place], or [place] counties for at least the last three months.   You cannot be eligible for state or federal sponsored [place], including [employer], [place], or [employer].   You generally cannot be eligible for healthcare insurance through your employer.    How to apply: Eligibility screenings are held every Tuesday and Wednesday afternoon from 1:00 pm until 4:00 pm. You do not need an appointment for the interview!  [facility] [address], [place], [place] [phone]   [facility] Health Department  [phone]   [facility] Health Department  [phone]   [facility] Health Department  [phone]    Behavioral Health Resources in the Community: Intensive Outpatient Programs Organization         Address  Phone  Notes  [facility] [address] [place], [place] [phone]   [facility] [address], [place], [place] [phone]   [facility] [address] [place], [place]  [phone]   [facility] Mental Health [address],  [place], [place] [phone] or [phone]   Substance Abuse Resources Organization         Address  Phone  Notes  [facility] Drug Services  [phone]   [facility]  [phone]   The  [place]  [phone]   Floydene Flock  [phone]   Residential & Outpatient Substance Abuse Program  [phone]   Psychological Services Organization         Address  Phone  Notes  [facility] Behavioral Health  [phone]   [facility]  [phone]   [facility] [address], [place] [phone] or [phone]    Mobile Crisis Teams Organization         Address  Phone  Notes  [facility], Mobile Crisis Care Unit  [phone]   [facility]  [address] [place], [place] [phone]   Doristine Locks [address] [place] [phone]    Self-Help/Support Groups Organization         Address  Phone             Notes  [facility] [place] - variety of support groups  336- I7437963 Call for more information  Narcotics Anonymous (NA), [facility] [address], [employer] [place]  2 meetings at this location   Statistician         Address  Phone  Notes  [facility] [address],    [place]  [phone]   [facility]  [address] [place] [address], [place], [place] [phone]   [facility] [address] [place], [place] [phone] Admissions: 8am-3pm M-F  [facility] [address],    [place], [place] [phone]   [facility] [address] Starling Manns [place], [place] [phone]   [facility] [address]  [place], [place] [phone]   [facility] - Intensive Outpatient [address]urell Joseph[address], [place], [place] [phone]   [facility] (Addiction Recovery [facility] [address] [place]  [place], [place] [phone] or [phone]   [facility] ([facility]) [address] [place], [place] [phone] Accepts Medicaid  [facility] [address],  [place] [phone] Substance Abuse/Addiction Treatment  [facility] Organization         Address  Phone  Notes  [facility]  [phone]   Angie Fava, PhD [address] Ervin Knack [place], [place]   [phone] or [phone]   [facility] Behavioral   [address] [place], [place] [phone]   [facility] [address], [place], [place] [phone] Insurance/Medicaid/sponsorship through [facility] and Families [address]                                    [place], [place] [phone] Therapy/tele-psych/case  [facility] [address], [place] [phone]    Dr. Lolly Mustache  [phone]   [facility]  [employer] [facility] Dept. 1) [address], [place] 2) [address] [place] 3)  [address], [place] [phone]  [phone]   [facility] Child Abuse Hotline [phone] or [phone] (After Hours)

## 2017-11-26 ENCOUNTER — Emergency Department (HOSPITAL_COMMUNITY)
Admission: EM | Admit: 2017-11-26 | Discharge: 2017-11-26 | Disposition: A | Discharge status: Home / Self Care | Payer: 59 | Attending: Emergency Medicine | Admitting: Emergency Medicine

## 2017-11-26 ENCOUNTER — Emergency Department (HOSPITAL_COMMUNITY): Payer: 59

## 2017-11-26 ENCOUNTER — Encounter (HOSPITAL_COMMUNITY): Payer: Self-pay | Admitting: Emergency Medicine

## 2017-11-26 DIAGNOSIS — H81399 Other peripheral vertigo, unspecified ear: Secondary | ICD-10-CM

## 2017-11-26 DIAGNOSIS — F17210 Nicotine dependence, cigarettes, uncomplicated: Secondary | ICD-10-CM | POA: Insufficient documentation

## 2017-11-26 DIAGNOSIS — R42 Dizziness and giddiness: Secondary | ICD-10-CM | POA: Diagnosis present

## 2017-11-26 LAB — RAPID URINE DRUG SCREEN, HOSP PERFORMED
Amphetamines: NOT DETECTED
Barbiturates: NOT DETECTED
Benzodiazepines: NOT DETECTED
Cocaine: NOT DETECTED
Opiates: NOT DETECTED
Tetrahydrocannabinol: POSITIVE — AB

## 2017-11-26 LAB — I-STAT TROPONIN, ED: Troponin i, poc: 0 ng/mL (ref 0.00–0.08)

## 2017-11-26 LAB — BASIC METABOLIC PANEL
Anion gap: 9 (ref 5–15)
BUN: 8 mg/dL (ref 6–20)
CALCIUM: 9.6 mg/dL (ref 8.9–10.3)
CO2: 26 mmol/L (ref 22–32)
Chloride: 102 mmol/L (ref 101–111)
Creatinine, Ser: 0.94 mg/dL (ref 0.61–1.24)
GFR calc Af Amer: >60 mL/min (ref >60)
Glucose, Bld: 105 mg/dL — ABNORMAL HIGH (ref 65–99)
Potassium: 3.5 mmol/L (ref 3.5–5.1)
Sodium: 137 mmol/L (ref 135–145)

## 2017-11-26 LAB — URINALYSIS, ROUTINE W REFLEX MICROSCOPIC
Bilirubin Urine: NEGATIVE
Glucose, UA: NEGATIVE mg/dL
KETONES UR: NEGATIVE mg/dL
Leukocytes, UA: NEGATIVE
NITRITE: NEGATIVE
PH: 8 (ref 5.0–8.0)
Protein, ur: NEGATIVE mg/dL
Specific Gravity, Urine: 1.008 (ref 1.005–1.030)
Squamous Epithelial / LPF: NONE SEEN

## 2017-11-26 LAB — CBC
HCT: 42.2 % (ref 39.0–52.0)
Hemoglobin: 14.7 g/dL (ref 13.0–17.0)
MCH: 30.9 pg (ref 26.0–34.0)
MCHC: 34.8 g/dL (ref 30.0–36.0)
MCV: 88.7 fL (ref 78.0–100.0)
Platelets: 216 10*3/uL (ref 150–400)
RBC: 4.76 MIL/uL (ref 4.22–5.81)
RDW: 16.2 % — ABNORMAL HIGH (ref 11.5–15.5)
WBC: 8.8 10*3/uL (ref 4.0–10.5)

## 2017-11-26 LAB — CBG MONITORING, ED: Glucose-Capillary: 86 mg/dL (ref 65–99)

## 2017-11-26 LAB — PROTIME-INR
INR: 0.97
Prothrombin Time: 12.7 seconds (ref 11.4–15.2)

## 2017-11-26 LAB — ETHANOL: Alcohol, Ethyl (B): <10 mg/dL (ref <10)

## 2017-11-26 MED ORDER — SODIUM CHLORIDE 0.9 % IV BOLUS (SEPSIS)
1000.0000 mL | Freq: Once | INTRAVENOUS | Status: AC
  Administered 2017-11-26: 1000 mL via INTRAVENOUS

## 2017-11-26 MED ORDER — MECLIZINE HCL 25 MG PO TABS
25.0000 mg | ORAL_TABLET | Freq: Once | ORAL | Status: AC
  Administered 2017-11-26: 25 mg via ORAL
  Filled 2017-11-26: qty 1

## 2017-11-26 MED ORDER — MECLIZINE HCL 25 MG PO TABS: 25.0000 mg | ORAL_TABLET | Freq: Three times a day (TID) | ORAL | 0 refills | Status: DC | PRN

## 2017-11-26 MED ORDER — DIPHENHYDRAMINE HCL 50 MG/ML IJ SOLN
12.5000 mg | Freq: Once | INTRAMUSCULAR | Status: AC
  Administered 2017-11-26: 12.5 mg via INTRAVENOUS
  Filled 2017-11-26: qty 1

## 2017-11-26 NOTE — ED Notes (Signed)
Pt ambulated down the hallway with no assistance or trouble. Pt reports that he is not feeling dizzy.

## 2017-11-26 NOTE — Discharge Instructions (Signed)
Return here as needed. Your testing here today does not show any significant abnormalities. This is most likely vertigo. Increase your fluid intake.

## 2017-11-26 NOTE — ED Triage Notes (Addendum)
Pt states he woke up with nausea and dizziness. States he woke up sweating. Denies pain anywhere. Went to work but felt worse and went home. Tried drinking vinegar and threw up. No chest pain, no headache. No unilateral weakness. No unilateral neurological symptoms. Denies fevers.   Addendum- pt very non specific in triage but now also endorsees that it is hard to breathe. HR 50s bradycardic.

## 2017-11-27 NOTE — ED Provider Notes (Signed)
[facility] EMERGENCY DEPARTMENT Provider Note   CSN: [id number] Arrival date & time: 11/26/17  0755     History   Chief Complaint Chief Complaint  Patient presents with  . Dizziness  . Nausea    HPI [name] is a 46 y.o. male.  HPI Patient presents to the emergency department with onset of dizziness that started this morning.  Patient states that after he woke up he started having dizziness described as room spinning he also states he had some nausea associated with it.  Patient states that he did not take any medications prior to arrival.  The patient denies chest pain, shortness of breath, headache,blurred vision, neck pain, fever, cough, weakness, numbness, anorexia, edema, abdominal pain, vomiting, diarrhea, rash, back pain, dysuria, hematemesis, bloody stool, near syncope, or syncope. History reviewed. No pertinent past medical history.  There are no active problems to display for this patient.   No past surgical history on file.     Home Medications    Prior to Admission medications   Medication Sig Start Date End Date Taking? Authorizing Provider  ibuprofen (ADVIL,MOTRIN) 800 MG tablet Take 1 tablet (800 mg total) by mouth 3 (three) times daily. Patient not taking: Reported on 11/26/2017 09/16/15   Patel-Mills, Lorelle Formosa, PA-C  meclizine (ANTIVERT) 25 MG tablet Take 1 tablet (25 mg total) by mouth 3 (three) times daily as needed for dizziness. 11/26/17   Brayen Bunn, Cristal Deer, PA-C  promethazine (PHENERGAN) 25 MG tablet Take 1 tablet (25 mg total) by mouth every 6 (six) hours as needed for nausea or vomiting. Patient not taking: Reported on 11/26/2017 01/29/14   Ward, Layla Maw, DO    Family History Family History  Problem Relation Age of Onset  . Cancer Mother   . Heart failure Father     Social History Social History   Tobacco Use  . Smoking status: Current Every Day Smoker    Packs/day: 0.50    Types: Cigarettes  Substance Use Topics  .  Alcohol use: No  . Drug use: Yes    Frequency: 2.0 times per week    Types: Marijuana     Allergies   Patient has no known allergies.   Review of Systems Review of Systems All other systems negative except as documented in the HPI. All pertinent positives and negatives as reviewed in the HPI.  Physical Exam Updated Vital Signs BP (!) 147/72   Pulse 78   Temp (!) 97.5 F (36.4 C)   Resp 19   Ht 5\' 6"  (1.676 m)   Wt 29.5 kg (65 lb)   SpO2 94%   BMI 10.49 kg/m   Physical Exam  Constitutional: He is oriented to person, place, and time. He appears well-developed and well-nourished. No distress.  HENT:  Head: Normocephalic and atraumatic.  Mouth/Throat: Oropharynx is clear and moist.  Eyes: Pupils are equal, round, and reactive to light.  Neck: Normal range of motion. Neck supple.  Cardiovascular: Normal rate, regular rhythm and normal heart sounds. Exam reveals no gallop and no friction rub.  No murmur heard. Pulmonary/Chest: Effort normal and breath sounds normal. No respiratory distress. He has no wheezes.  Abdominal: Soft. Bowel sounds are normal. He exhibits no distension. There is no tenderness.  Neurological: He is alert and oriented to person, place, and time. He has normal strength. No sensory deficit. He exhibits normal muscle tone. Coordination and gait normal.  Patient is ambulated without difficulty.  The patient  Skin: Skin is warm  and dry. Capillary refill takes less than 2 seconds. No rash noted. No erythema.  Psychiatric: He has a normal mood and affect. His behavior is normal.  Nursing note and vitals reviewed.    ED Treatments / Results  Labs (all labs ordered are listed, but only abnormal results are displayed) Labs Reviewed  BASIC METABOLIC PANEL - Abnormal; Notable for the following components:      Result Value   Glucose, Bld 105 (*)    All other components within normal limits  CBC - Abnormal; Notable for the following components:   RDW 16.2  (*)    All other components within normal limits  URINALYSIS, ROUTINE W REFLEX MICROSCOPIC - Abnormal; Notable for the following components:   APPearance CLOUDY (*)    Hgb urine dipstick SMALL (*)    Bacteria, UA MANY (*)    All other components within normal limits  RAPID URINE DRUG SCREEN, HOSP PERFORMED - Abnormal; Notable for the following components:   Tetrahydrocannabinol POSITIVE (*)    All other components within normal limits  PROTIME-INR  ETHANOL  CBG MONITORING, ED  I-STAT TROPONIN, ED    EKG  EKG Interpretation  Date/Time:  Saturday November 26 2017 08:22:23 EST Ventricular Rate:  54 PR Interval:  182 QRS Duration: 82 QT Interval:  430 QTC Calculation: 407 R Axis:   60 Text Interpretation:  Sinus bradycardia Otherwise normal ECG Confirmed by Rolland PorterJames, Mark ([id number]) on 11/26/2017 9:12:30 AM       Radiology Dg Chest 2 View  Result Date: 11/26/2017 CLINICAL DATA:  46 year old male with dizziness, shortness of breath and sweating since this morning. Symptoms have since improved. EXAM: CHEST  2 VIEW COMPARISON:  01/29/2014 FINDINGS: The heart size and mediastinal contours are within normal limits. Both lungs are clear. The visualized skeletal structures are unremarkable. IMPRESSION: No active cardiopulmonary disease. Electronically Signed   By: Sande BrothersSerena  Chacko M.D.   On: 11/26/2017 09:04    Procedures Procedures (including critical care time)  Medications Ordered in ED Medications  sodium chloride 0.9 % bolus 1,000 mL (0 mLs Intravenous Stopped 11/26/17 1106)  diphenhydrAMINE (BENADRYL) injection 12.5 mg (12.5 mg Intravenous Given 11/26/17 1017)  meclizine (ANTIVERT) tablet 25 mg (25 mg Oral Given 11/26/17 1017)     Initial Impression / Assessment and Plan / ED Course  I have reviewed the triage vital signs and the nursing notes.  Pertinent labs & imaging results that were available during my care of the patient were reviewed by me and considered in my medical  decision making (see chart for details).     Patient has ambulated without any difficulties after receiving Benadryl and meclizine he also received IV fluids.  The patient states he feels considerably better.  I feel that this is vertigo based on his HPI and physical exam findings.   posterior circulation infarct was a concern due to the fact that he has most likely undiagnosed hypertension.  The patient resolved the symptoms after medications and was able to ambulate without difficulty.  I feel that the likelihood of this is low at this point.  Told to follow-up with a primary doctor told return here for any worsening in his condition.  Final Clinical Impressions(s) / ED Diagnoses   Final diagnoses:  Peripheral vertigo, unspecified laterality    ED Discharge Orders        Ordered    meclizine (ANTIVERT) 25 MG tablet  3 times daily PRN     11/26/17 1329  Charlestine NightLawyer, Gordana Kewley, PA-C 11/27/17 0749    Charlynne PanderYao, Saahas Hsienta, MD 11/27/17 [phone]

## 2018-04-23 ENCOUNTER — Encounter (HOSPITAL_COMMUNITY): Payer: Self-pay | Admitting: Emergency Medicine

## 2018-04-23 ENCOUNTER — Emergency Department (HOSPITAL_COMMUNITY)
Admission: EM | Admit: 2018-04-23 | Discharge: 2018-04-23 | Disposition: A | Discharge status: Home / Self Care | Payer: 59 | Attending: Emergency Medicine | Admitting: Emergency Medicine

## 2018-04-23 ENCOUNTER — Other Ambulatory Visit: Payer: Self-pay

## 2018-04-23 DIAGNOSIS — F17210 Nicotine dependence, cigarettes, uncomplicated: Secondary | ICD-10-CM | POA: Insufficient documentation

## 2018-04-23 DIAGNOSIS — I10 Essential (primary) hypertension: Secondary | ICD-10-CM | POA: Diagnosis not present

## 2018-04-23 DIAGNOSIS — R42 Dizziness and giddiness: Secondary | ICD-10-CM | POA: Insufficient documentation

## 2018-04-23 LAB — CBC
HEMATOCRIT: 42.4 % (ref 39.0–52.0)
HEMOGLOBIN: 14.2 g/dL (ref 13.0–17.0)
MCH: 30.1 pg (ref 26.0–34.0)
MCHC: 33.5 g/dL (ref 30.0–36.0)
MCV: 90 fL (ref 78.0–100.0)
Platelets: 213 10*3/uL (ref 150–400)
RBC: 4.71 MIL/uL (ref 4.22–5.81)
RDW: 16.1 % — ABNORMAL HIGH (ref 11.5–15.5)
WBC: 6.5 10*3/uL (ref 4.0–10.5)

## 2018-04-23 LAB — URINALYSIS, ROUTINE W REFLEX MICROSCOPIC
Bilirubin Urine: NEGATIVE
Glucose, UA: NEGATIVE mg/dL
HGB URINE DIPSTICK: NEGATIVE
Ketones, ur: NEGATIVE mg/dL
Leukocytes, UA: NEGATIVE
Nitrite: NEGATIVE
PH: 7 (ref 5.0–8.0)
Protein, ur: NEGATIVE mg/dL
SPECIFIC GRAVITY, URINE: 1.014 (ref 1.005–1.030)

## 2018-04-23 LAB — BASIC METABOLIC PANEL
Anion gap: 9 (ref 5–15)
BUN: 9 mg/dL (ref 6–20)
CALCIUM: 9.3 mg/dL (ref 8.9–10.3)
CO2: 24 mmol/L (ref 22–32)
Chloride: 106 mmol/L (ref 101–111)
Creatinine, Ser: 0.88 mg/dL (ref 0.61–1.24)
GFR calc Af Amer: >60 mL/min (ref >60)
GFR calc non Af Amer: >60 mL/min (ref >60)
GLUCOSE: 100 mg/dL — AB (ref 65–99)
POTASSIUM: 3.5 mmol/L (ref 3.5–5.1)
Sodium: 139 mmol/L (ref 135–145)

## 2018-04-23 LAB — CBG MONITORING, ED: Glucose-Capillary: 99 mg/dL (ref 65–99)

## 2018-04-23 LAB — I-STAT TROPONIN, ED: Troponin i, poc: 0 ng/mL (ref 0.00–0.08)

## 2018-04-23 MED ORDER — ONDANSETRON HCL 4 MG/2ML IJ SOLN
4.0000 mg | Freq: Once | INTRAMUSCULAR | Status: AC
  Administered 2018-04-23: 4 mg via INTRAVENOUS
  Filled 2018-04-23: qty 2

## 2018-04-23 MED ORDER — MECLIZINE HCL 25 MG PO TABS: 25.0000 mg | ORAL_TABLET | Freq: Three times a day (TID) | ORAL | 0 refills | Status: AC | PRN

## 2018-04-23 MED ORDER — MECLIZINE HCL 25 MG PO TABS
25.0000 mg | ORAL_TABLET | Freq: Once | ORAL | Status: AC
  Administered 2018-04-23: 25 mg via ORAL
  Filled 2018-04-23: qty 1

## 2018-04-23 NOTE — Discharge Instructions (Signed)
You were evaluated in the emergency department for elevated blood pressure and dizziness.  You likely have some type of vertigo which causes you to feel very nauseous in the room to spin.  We are prescribe you meclizine which you were on in the past and it may help your symptoms.  You will need to get her primary care doctor to have further evaluation for this and for better control of your blood pressure.  You will she should see an ear nose throat specialist to see if there is any other treatment for your dizziness that may help you.

## 2018-04-23 NOTE — ED Notes (Signed)
Patient verbalizes understanding of discharge instructions. Opportunity for questioning and answers were provided. Armband removed by staff, pt discharged from ED.  

## 2018-04-23 NOTE — ED Notes (Signed)
Pt remains diaphoretic and nauseated.

## 2018-04-23 NOTE — ED Provider Notes (Signed)
[facility] EMERGENCY DEPARTMENT Provider Note   CSN: [id number] Arrival date & time: 04/23/18  0757     History   Chief Complaint Chief Complaint  Patient presents with  . Hypertension  . Dizziness    HPI [name] is a 47 y.o. male.  He presents to the emergency department complaining of worsening dizziness/room spinning since 4 AM.  He states he has dizziness every day worsens with head movement or getting up quickly.  He attributes this to a head injury sustained about 15 years ago.  He has had meclizine in the past which helped the symptoms somewhat.  He is noticed that his blood pressures have been more elevated and his dizziness is worse.  It caused him to feel nauseous and want to vomit.  It is associated sometimes with shortness of breath.  Currently his symptoms are improved since arrival, no intervention.  He denies any recent illness no fevers no cough no visual symptoms no numbness no weakness.  He has had no recent head injuries.  The history is provided by the patient.  Hypertension  This is a recurrent problem. The current episode started 3 to 5 hours ago. The problem has been gradually improving. Associated symptoms include shortness of breath. Pertinent negatives include no chest pain, no abdominal pain and no headaches. Exacerbated by: dizziness. The symptoms are relieved by rest. He has tried nothing for the symptoms.  Dizziness  Quality:  Head spinning, room spinning and vertigo Severity:  Moderate Onset quality:  Gradual Timing:  Intermittent Progression:  Improving Chronicity:  Chronic Context: head movement and standing up   Relieved by:  Being still and change in position Worsened by:  Movement, sitting upright and standing up Associated symptoms: nausea and shortness of breath   Associated symptoms: no chest pain, no headaches, no palpitations, no syncope, no vision changes, no vomiting and no weakness     History reviewed. No  pertinent past medical history.  There are no active problems to display for this patient.   History reviewed. No pertinent surgical history.      Home Medications    Prior to Admission medications   Medication Sig Start Date End Date Taking? Authorizing Provider  ibuprofen (ADVIL,MOTRIN) 800 MG tablet Take 1 tablet (800 mg total) by mouth 3 (three) times daily. Patient not taking: Reported on 11/26/2017 09/16/15   Patel-Mills, Lorelle Formosa, PA-C  meclizine (ANTIVERT) 25 MG tablet Take 1 tablet (25 mg total) by mouth 3 (three) times daily as needed for dizziness. Patient not taking: Reported on 04/23/2018 11/26/17   Charlestine Night, PA-C  promethazine (PHENERGAN) 25 MG tablet Take 1 tablet (25 mg total) by mouth every 6 (six) hours as needed for nausea or vomiting. Patient not taking: Reported on 11/26/2017 01/29/14   Ward, Layla Maw, DO    Family History Family History  Problem Relation Age of Onset  . Cancer Mother   . Heart failure Father     Social History Social History   Tobacco Use  . Smoking status: Current Every Day Smoker    Packs/day: 0.50    Types: Cigarettes  . Smokeless tobacco: Current User  Substance Use Topics  . Alcohol use: No  . Drug use: Yes    Frequency: 2.0 times per week    Types: Marijuana     Allergies   Patient has no known allergies.   Review of Systems Review of Systems  Constitutional: Negative for chills and fever.  HENT: Negative for  ear pain and sore throat.   Eyes: Negative for pain and visual disturbance.  Respiratory: Positive for shortness of breath. Negative for cough.   Cardiovascular: Negative for chest pain, palpitations and syncope.  Gastrointestinal: Positive for nausea. Negative for abdominal pain and vomiting.  Genitourinary: Negative for dysuria and hematuria.  Musculoskeletal: Negative for arthralgias and back pain.  Skin: Negative for color change and rash.  Neurological: Positive for dizziness. Negative for seizures,  syncope, weakness and headaches.  All other systems reviewed and are negative.    Physical Exam Updated Vital Signs BP (!) 182/85 (BP Location: Left Arm)   Pulse (!) 54   Temp 97.7 F (36.5 C) (Oral)   Resp 20   Ht  (1.651 m)   Wt 72.6 kg (160 lb)   SpO2 100%   BMI 26.63 kg/m   Physical Exam  Constitutional: He is oriented to person, place, and time. He appears well-developed and well-nourished.  HENT:  Head: Normocephalic and atraumatic.  Eyes: Conjunctivae are normal. Right eye exhibits nystagmus (few beats of nystagmus). Left eye exhibits nystagmus.  Neck: Neck supple.  Cardiovascular: Normal rate and regular rhythm.  No murmur heard. Pulmonary/Chest: Effort normal and breath sounds normal. No respiratory distress.  Abdominal: Soft. There is no tenderness.  Musculoskeletal: He exhibits no edema, tenderness or deformity.  Neurological: He is alert and oriented to person, place, and time. He has normal strength. No cranial nerve deficit or sensory deficit. GCS eye subscore is 4. GCS verbal subscore is 5. GCS motor subscore is 6.  Skin: Skin is warm and dry.  Psychiatric: He has a normal mood and affect.  Nursing note and vitals reviewed.    ED Treatments / Results  Labs (all labs ordered are listed, but only abnormal results are displayed) Labs Reviewed  BASIC METABOLIC PANEL - Abnormal; Notable for the following components:      Result Value   Glucose, Bld 100 (*)    All other components within normal limits  CBC - Abnormal; Notable for the following components:   RDW 16.1 (*)    All other components within normal limits  URINALYSIS, ROUTINE W REFLEX MICROSCOPIC  CBG MONITORING, ED  I-STAT TROPONIN, ED    EKG EKG Interpretation  Date/Time:  Sunday Apr 23 2018 08:16:39 EDT Ventricular Rate:  54 PR Interval:  176 QRS Duration: 80 QT Interval:  422 QTC Calculation: 400 R Axis:   72 Text Interpretation:  Sinus bradycardia Otherwise normal ECG similar  to prior 12/18 Confirmed by Meridee Score [phone]) on 04/23/2018 9:29:52 AM   Radiology No results found.  Procedures Procedures (including critical care time)  Medications Ordered in ED Medications  meclizine (ANTIVERT) tablet 25 mg (has no administration in time range)  ondansetron (ZOFRAN) injection 4 mg (has no administration in time range)     Initial Impression / Assessment and Plan / ED Course  I have reviewed the triage vital signs and the nursing notes.  Pertinent labs & imaging results that were available during my care of the patient were reviewed by me and considered in my medical decision making (see chart for details).  Clinical Course as of Apr 24 820  Wynelle Link Apr 23, 2018  1201 Reevaluated patient.  He is dizziness is improved and his blood pressure is also improved.  He is comfortable going home we will represcribed his meclizine and he understands he needs to follow-up with primary care for further evaluation.   [MB]    Clinical Course  User Index [MB] Terrilee Files, MD     Final Clinical Impressions(s) / ED Diagnoses   Final diagnoses:  Dizziness  Hypertension, unspecified type    ED Discharge Orders        Ordered    meclizine (ANTIVERT) 25 MG tablet  3 times daily PRN     04/23/18 1202       Terrilee Files, MD 04/24/18 [phone]

## 2018-04-23 NOTE — ED Triage Notes (Signed)
Pt. Stated, I have high BP and feel dizzy. This episode started this morning.

## 2018-04-24 ENCOUNTER — Encounter (HOSPITAL_COMMUNITY): Payer: Self-pay | Admitting: Emergency Medicine

## 2018-05-04 ENCOUNTER — Ambulatory Visit: Payer: 59 | Attending: Family Medicine | Admitting: Physician Assistant

## 2018-05-04 ENCOUNTER — Other Ambulatory Visit: Payer: Self-pay

## 2018-05-04 VITALS — BP 158/84 | HR 65 | Temp 98.5°F | Resp 18 | Ht 65.0 in | Wt 160.2 lb

## 2018-05-04 DIAGNOSIS — I10 Essential (primary) hypertension: Secondary | ICD-10-CM | POA: Insufficient documentation

## 2018-05-04 DIAGNOSIS — R21 Rash and other nonspecific skin eruption: Secondary | ICD-10-CM | POA: Diagnosis not present

## 2018-05-04 DIAGNOSIS — Z79899 Other long term (current) drug therapy: Secondary | ICD-10-CM | POA: Diagnosis not present

## 2018-05-04 DIAGNOSIS — F17200 Nicotine dependence, unspecified, uncomplicated: Secondary | ICD-10-CM

## 2018-05-04 DIAGNOSIS — R42 Dizziness and giddiness: Secondary | ICD-10-CM | POA: Insufficient documentation

## 2018-05-04 MED ORDER — LISINOPRIL 5 MG PO TABS: 5.0000 mg | ORAL_TABLET | Freq: Every day | ORAL | 1 refills | Status: AC

## 2018-05-04 MED ORDER — HYDROCORTISONE 0.5 % EX CREA: 1.0000 "application " | TOPICAL_CREAM | Freq: Two times a day (BID) | CUTANEOUS | 0 refills | Status: DC

## 2018-05-04 NOTE — Progress Notes (Signed)
[name]  ZOX:[id number]  [id number]  DOB - 06-18-1971  Chief Complaint  Patient presents with  . Follow-up    ED-HTN       Subjective:   [name] is a 47 y.o. male here today for establishment of care. He ha a PMHx of a head injury remotely and vertigo.  He presented to the emergency department on 04/23/2018 with dizziness and feeling like the room was spinning.  It was worse with sudden movements.  There was some associated nausea.  He was noted to have elevated blood pressure with a systolic of 182.  His heart rate was in the 50s.  His physical exam was positive for bilateral nystagmus.  His labs were unremarkable, urinalysis unremarkable, troponin negative and EKG showed sinus bradycardia.  He was treated with Antivert and antiemetics.  He was told to follow-up here for elevations in his blood pressure.  He also has been complaining of a rash, not so bad right now but sometimes has flares.  EEG.  Blisterlike at times.  Mostly on the outer aspect of bilateral back.  Meclizine has helped the vertigo.  He was not started on any blood pressure medication.  No dizziness.  No headaches.  No palpitations.  No chest pain.  No shortness of breath.  ROS: GEN: denies fever or chills, denies change in weight Skin: denies lesions + rashes HEENT: denies headache, earache, epistaxis, sore throat, or neck pain LUNGS: denies SHOB, dyspnea, PND, orthopnea CV: denies CP or palpitations ABD: denies abd pain, N or V EXT: denies muscle spasms or swelling; no pain in lower ext, no weakness NEURO: denies numbness or tingling, denies sz, stroke or TIA   ALLERGIES: No Known Allergies  PAST MEDICAL HISTORY: Vertigo  PAST SURGICAL HISTORY: No operations  MEDICATIONS AT HOME: Prior to Admission medications   Medication Sig Start Date End Date Taking? Authorizing Provider  meclizine (ANTIVERT) 25 MG tablet Take 1 tablet (25 mg total) by mouth 3 (three) times daily as needed for  dizziness. 04/23/18  Yes Terrilee Files, MD  hydrocortisone cream 0.5 % Apply 1 application topically 2 (two) times daily. 05/04/18   Vivianne Master, PA-C  ibuprofen (ADVIL,MOTRIN) 800 MG tablet Take 1 tablet (800 mg total) by mouth 3 (three) times daily. Patient not taking: Reported on 11/26/2017 09/16/15   Patel-Mills, Lorelle Formosa, PA-C  lisinopril (PRINIVIL,ZESTRIL) 5 MG tablet Take 1 tablet (5 mg total) by mouth daily. 05/04/18   Vivianne Master, PA-C  promethazine (PHENERGAN) 25 MG tablet Take 1 tablet (25 mg total) by mouth every 6 (six) hours as needed for nausea or vomiting. Patient not taking: Reported on 11/26/2017 01/29/14   Ward, Layla Maw, DO    Family History  Problem Relation Age of Onset  . Cancer Mother   . Heart failure Father     @  Objective:   Vitals:   05/04/18 1442  BP: (!) 158/84  Pulse: 65  Resp: 18  Temp: 98.5 F (36.9 C)  TempSrc: Oral  SpO2: 94%  Weight: 160 lb 3.2 oz (72.7 kg)  Height:  (1.651 m)    Exam General appearance : Awake, alert, not in any distress. Speech Clear. Not toxic looking HEENT: Atraumatic and Normocephalic, pupils equally reactive to light and accomodation Neck: supple, no JVD. No cervical lymphadenopathy.  Chest:Good air entry bilaterally, no added sounds  CVS: S1 S2 regular, no murmurs.  Abdomen: Bowel sounds present, Non tender and not distended with no guarding, rigidity  or rebound. Extremities: B/L Lower Ext shows no edema, both legs are warm to touch Neurology: Awake alert, and oriented X 3, CN II-XII intact, Non focal Skin:dried up quarter sized rash on outer aspect of back on both sides Wounds:N/A   Assessment & Plan  1. HTN  -low dose ACE  -BMP in 2 weeks  -RN visit for BP check in 2 weeks  2. Rash  -Hydrocortisone cream for now   -encouraged to call when having a "flare"  3. Vertigo  -Meclizine prn   Return in about 2 weeks (around 05/18/2018).  The patient was given clear instructions to go to  ER or return to medical center if symptoms don't improve, worsen or new problems develop. The patient verbalized understanding. The patient was told to call to get lab results if they haven't heard anything in the next week.   Total time spent with patient was 33 min. Greater than 50 % of this visit was spent face to face counseling and coordinating care regarding risk factor modification, compliance importance and encouragement, education related to HTN.  This note has been created with Education officer, environmental. Any transcriptional errors are unintentional.    Scot Jun, PA-C [facility], [place] [phone]   05/04/2018, 3:58 PM

## 2018-05-04 NOTE — Patient Instructions (Signed)
Stop smoking Eliminate salt 30 min of exercise 3-5 times per week Return in 2 weeks

## 2018-05-18 ENCOUNTER — Ambulatory Visit: Payer: 59 | Attending: Family Medicine | Admitting: *Deleted

## 2018-05-18 VITALS — BP 124/62 | HR 66

## 2018-05-18 DIAGNOSIS — I10 Essential (primary) hypertension: Secondary | ICD-10-CM | POA: Insufficient documentation

## 2018-05-18 NOTE — Progress Notes (Signed)
[name] arrived to [facility] alert and oriented in good spirits. Last OV on was on 05/04/2018. His blood pressure was 158/84.  Pt denies chest pain, SOB, HA, or blurred vision.  Verified medication with patient. Pt states medication was taken this morning.  Manual blood pressure reading: 124/62  Pt given reminder of the next appointment: June 28th with Bertram Denver at [phone].

## 2018-06-16 ENCOUNTER — Ambulatory Visit: Payer: 59 | Attending: Nurse Practitioner | Admitting: Nurse Practitioner

## 2018-06-16 ENCOUNTER — Encounter: Payer: Self-pay | Admitting: Nurse Practitioner

## 2018-06-16 VITALS — BP 134/74 | HR 61 | Temp 98.4°F | Ht 65.0 in | Wt 157.6 lb

## 2018-06-16 DIAGNOSIS — I10 Essential (primary) hypertension: Secondary | ICD-10-CM | POA: Diagnosis not present

## 2018-06-16 DIAGNOSIS — Z7952 Long term (current) use of systemic steroids: Secondary | ICD-10-CM | POA: Diagnosis not present

## 2018-06-16 DIAGNOSIS — F17210 Nicotine dependence, cigarettes, uncomplicated: Secondary | ICD-10-CM

## 2018-06-16 DIAGNOSIS — Z809 Family history of malignant neoplasm, unspecified: Secondary | ICD-10-CM | POA: Insufficient documentation

## 2018-06-16 DIAGNOSIS — Z8249 Family history of ischemic heart disease and other diseases of the circulatory system: Secondary | ICD-10-CM | POA: Diagnosis not present

## 2018-06-16 DIAGNOSIS — Z79899 Other long term (current) drug therapy: Secondary | ICD-10-CM | POA: Insufficient documentation

## 2018-06-16 NOTE — Patient Instructions (Signed)
DASH Eating Plan DASH stands for "Dietary Approaches to Stop Hypertension." The DASH eating plan is a healthy eating plan that has been shown to reduce high blood pressure (hypertension). It may also reduce your risk for type 2 diabetes, heart disease, and stroke. The DASH eating plan may also help with weight loss. What are tips for following this plan? General guidelines  Avoid eating more than 2,300 mg (milligrams) of salt (sodium) a day. If you have hypertension, you may need to reduce your sodium intake to 1,500 mg a day.  Limit alcohol intake to no more than 1 drink a day for nonpregnant women and 2 drinks a day for men. One drink equals 12 oz of beer, 5 oz of wine, or 1 oz of hard liquor.  Work with your health care provider to maintain a healthy body weight or to lose weight. Ask what an ideal weight is for you.  Get at least 30 minutes of exercise that causes your heart to beat faster (aerobic exercise) most days of the week. Activities may include walking, swimming, or biking.  Work with your health care provider or diet and nutrition specialist (dietitian) to adjust your eating plan to your individual calorie needs. Reading food labels  Check food labels for the amount of sodium per serving. Choose foods with less than 5 percent of the Daily Value of sodium. Generally, foods with less than 300 mg of sodium per serving fit into this eating plan.  To find whole grains, look for the word "whole" as the first word in the ingredient list. Shopping  Buy products labeled as "low-sodium" or "no salt added."  Buy fresh foods. Avoid canned foods and premade or frozen meals. Cooking  Avoid adding salt when cooking. Use salt-free seasonings or herbs instead of table salt or sea salt. Check with your health care provider or pharmacist before using salt substitutes.  Do not fry foods. Cook foods using healthy methods such as baking, boiling, grilling, and broiling instead.  Cook with  heart-healthy oils, such as olive, canola, soybean, or sunflower oil. Meal planning   Eat a balanced diet that includes: ? 5 or more servings of fruits and vegetables each day. At each meal, try to fill half of your plate with fruits and vegetables. ? Up to 6-8 servings of whole grains each day. ? Less than 6 oz of lean meat, poultry, or fish each day. A 3-oz serving of meat is about the same size as a deck of cards. One egg equals 1 oz. ? 2 servings of low-fat dairy each day. ? A serving of nuts, seeds, or beans 5 times each week. ? Heart-healthy fats. Healthy fats called Omega-3 fatty acids are found in foods such as flaxseeds and coldwater fish, like sardines, salmon, and mackerel.  Limit how much you eat of the following: ? Canned or prepackaged foods. ? Food that is high in trans fat, such as fried foods. ? Food that is high in saturated fat, such as fatty meat. ? Sweets, desserts, sugary drinks, and other foods with added sugar. ? Full-fat dairy products.  Do not salt foods before eating.  Try to eat at least 2 vegetarian meals each week.  Eat more home-cooked food and less restaurant, buffet, and fast food.  When eating at a restaurant, ask that your food be prepared with less salt or no salt, if possible. What foods are recommended? The items listed may not be a complete list. Talk with your dietitian about what   dietary choices are best for you. Grains Whole-grain or whole-wheat bread. Whole-grain or whole-wheat pasta. Brown rice. Oatmeal. Quinoa. Bulgur. Whole-grain and low-sodium cereals. Pita bread. Low-fat, low-sodium crackers. Whole-wheat flour tortillas. Vegetables Fresh or frozen vegetables (raw, steamed, roasted, or grilled). Low-sodium or reduced-sodium tomato and vegetable juice. Low-sodium or reduced-sodium tomato sauce and tomato paste. Low-sodium or reduced-sodium canned vegetables. Fruits All fresh, dried, or frozen fruit. Canned fruit in natural juice (without  added sugar). Meat and other protein foods Skinless chicken or turkey. Ground chicken or turkey. Pork with fat trimmed off. Fish and seafood. Egg whites. Dried beans, peas, or lentils. Unsalted nuts, nut butters, and seeds. Unsalted canned beans. Lean cuts of beef with fat trimmed off. Low-sodium, lean deli meat. Dairy Low-fat (1%) or fat-free (skim) milk. Fat-free, low-fat, or reduced-fat cheeses. Nonfat, low-sodium ricotta or cottage cheese. Low-fat or nonfat yogurt. Low-fat, low-sodium cheese. Fats and oils Soft margarine without trans fats. Vegetable oil. Low-fat, reduced-fat, or light mayonnaise and salad dressings (reduced-sodium). Canola, safflower, olive, soybean, and sunflower oils. Avocado. Seasoning and other foods Herbs. Spices. Seasoning mixes without salt. Unsalted popcorn and pretzels. Fat-free sweets. What foods are not recommended? The items listed may not be a complete list. Talk with your dietitian about what dietary choices are best for you. Grains Baked goods made with fat, such as croissants, muffins, or some breads. Dry pasta or rice meal packs. Vegetables Creamed or fried vegetables. Vegetables in a cheese sauce. Regular canned vegetables (not low-sodium or reduced-sodium). Regular canned tomato sauce and paste (not low-sodium or reduced-sodium). Regular tomato and vegetable juice (not low-sodium or reduced-sodium). Pickles. Olives. Fruits Canned fruit in a light or heavy syrup. Fried fruit. Fruit in cream or butter sauce. Meat and other protein foods Fatty cuts of meat. Ribs. Fried meat. Bacon. Sausage. Bologna and other processed lunch meats. Salami. Fatback. Hotdogs. Bratwurst. Salted nuts and seeds. Canned beans with added salt. Canned or smoked fish. Whole eggs or egg yolks. Chicken or turkey with skin. Dairy Whole or 2% milk, cream, and half-and-half. Whole or full-fat cream cheese. Whole-fat or sweetened yogurt. Full-fat cheese. Nondairy creamers. Whipped toppings.  Processed cheese and cheese spreads. Fats and oils Butter. Stick margarine. Lard. Shortening. Ghee. Bacon fat. Tropical oils, such as coconut, palm kernel, or palm oil. Seasoning and other foods Salted popcorn and pretzels. Onion salt, garlic salt, seasoned salt, table salt, and sea salt. Worcestershire sauce. Tartar sauce. Barbecue sauce. Teriyaki sauce. Soy sauce, including reduced-sodium. Steak sauce. Canned and packaged gravies. Fish sauce. Oyster sauce. Cocktail sauce. Horseradish that you find on the shelf. Ketchup. Mustard. Meat flavorings and tenderizers. Bouillon cubes. Hot sauce and Tabasco sauce. Premade or packaged marinades. Premade or packaged taco seasonings. Relishes. Regular salad dressings. Where to find more information:  National Heart, Lung, and Blood Institute: www.nhlbi.nih.gov  American Heart Association: www.heart.org Summary  The DASH eating plan is a healthy eating plan that has been shown to reduce high blood pressure (hypertension). It may also reduce your risk for type 2 diabetes, heart disease, and stroke.  With the DASH eating plan, you should limit salt (sodium) intake to 2,300 mg a day. If you have hypertension, you may need to reduce your sodium intake to 1,500 mg a day.  When on the DASH eating plan, aim to eat more fresh fruits and vegetables, whole grains, lean proteins, low-fat dairy, and heart-healthy fats.  Work with your health care provider or diet and nutrition specialist (dietitian) to adjust your eating plan to your individual   calorie needs. This information is not intended to replace advice given to you by your health care provider. Make sure you discuss any questions you have with your health care provider. Document Released: 11/25/2011 Document Revised: 11/29/2016 Document Reviewed: 11/29/2016 Elsevier Interactive Patient Education  2018 [employer].  Health Risks of Smoking Smoking cigarettes is very bad for your health. Tobacco smoke has  over 200 known poisons in it. It contains the poisonous gases nitrogen oxide and carbon monoxide. There are over 60 chemicals in tobacco smoke that cause cancer. Smoking is difficult to quit because a chemical in tobacco, called nicotine, causes addiction or dependence. When you smoke and inhale, nicotine is absorbed rapidly into the bloodstream through your lungs. Both inhaled and non-inhaled nicotine may be addictive. What are the risks of cigarette smoke? Cigarette smokers have an increased risk of many serious medical problems, including:  Lung cancer.  Lung disease, such as pneumonia, bronchitis, and emphysema.  Chest pain (angina) and heart attack because the heart is not getting enough oxygen.  Heart disease and peripheral blood vessel disease.  High blood pressure (hypertension).  Stroke.  Oral cancer, including cancer of the lip, mouth, or voice box.  Bladder cancer.  Pancreatic cancer.  Cervical cancer.  Pregnancy complications, including premature birth.  Stillbirths and smaller newborn babies, birth defects, and genetic damage to sperm.  Early menopause.  Lower estrogen level for women.  Infertility.  Facial wrinkles.  Blindness.  Increased risk of broken bones (fractures).  Senile dementia.  Stomach ulcers and internal bleeding.  Delayed wound healing and increased risk of complications during surgery.  Even smoking lightly shortens your life expectancy by several years.  Because of secondhand smoke exposure, children of smokers have an increased risk of the following:  Sudden infant death syndrome (SIDS).  Respiratory infections.  Lung cancer.  Heart disease.  Ear infections.  What are the benefits of quitting? There are many health benefits of quitting smoking. Here are some of them:  Within days of quitting smoking, your risk of having a heart attack decreases, your blood flow improves, and your lung capacity improves. Blood pressure,  pulse rate, and breathing patterns start returning to normal soon after quitting.  Within months, your lungs may clear up completely.  Quitting for 10 years reduces your risk of developing lung cancer and heart disease to almost that of a nonsmoker.  People who quit may see an improvement in their overall quality of life.  How do I quit smoking? Smoking is an addiction with both physical and psychological effects, and longtime habits can be hard to change. Your health care provider can recommend:  Programs and community resources, which may include group support, education, or talk therapy.  Prescription medicines to help reduce cravings.  Nicotine replacement products, such as patches, gum, and nasal sprays. Use these products only as directed. Do not replace cigarette smoking with electronic cigarettes, which are commonly called e-cigarettes. The safety of e-cigarettes is not known, and some may contain harmful chemicals.  A combination of two or more of these methods.  Where to find more information:  American Lung Association: www.lung.org  American Cancer Society: www.cancer.org Summary  Smoking cigarettes is very bad for your health. Cigarette smokers have an increased risk of many serious medical problems, including several cancers, heart disease, and stroke.  Smoking is an addiction with both physical and psychological effects, and longtime habits can be hard to change.  By stopping right away, you can greatly reduce the risk  of medical problems for you and your family.  To help you quit smoking, your health care provider can recommend programs, community resources, prescription medicines, and nicotine replacement products such as patches, gum, and nasal sprays. This information is not intended to replace advice given to you by your health care provider. Make sure you discuss any questions you have with your health care provider. Document Released: 01/13/2005 Document  Revised: 12/10/2016 Document Reviewed: 12/10/2016 Elsevier Interactive Patient Education  2017 [employer]  Steps to Quit Smoking Smoking tobacco can be bad for your health. It can also affect almost every organ in your body. Smoking puts you and people around you at risk for many serious long-lasting (chronic) diseases. Quitting smoking is hard, but it is one of the best things that you can do for your health. It is never too late to quit. What are the benefits of quitting smoking? When you quit smoking, you lower your risk for getting serious diseases and conditions. They can include:  Lung cancer or lung disease.  Heart disease.  Stroke.  Heart attack.  Not being able to have children (infertility).  Weak bones (osteoporosis) and broken bones (fractures).  If you have coughing, wheezing, and shortness of breath, those symptoms may get better when you quit. You may also get sick less often. If you are pregnant, quitting smoking can help to lower your chances of having a baby of low birth weight. What can I do to help me quit smoking? Talk with your doctor about what can help you quit smoking. Some things you can do (strategies) include:  Quitting smoking totally, instead of slowly cutting back how much you smoke over a period of time.  Going to in-person counseling. You are more likely to quit if you go to many counseling sessions.  Using resources and support systems, such as: ? Agricultural engineernline chats with a Veterinary surgeoncounselor. ? Phone quitlines. ? Automotive engineerrinted self-help materials. ? Support groups or group counseling. ? Text messaging programs. ? Mobile phone apps or applications.  Taking medicines. Some of these medicines may have nicotine in them. If you are pregnant or breastfeeding, do not take any medicines to quit smoking unless your doctor says it is okay. Talk with your doctor about counseling or other things that can help you.  Talk with your doctor about using more than one strategy at  the same time, such as taking medicines while you are also going to in-person counseling. This can help make quitting easier. What things can I do to make it easier to quit? Quitting smoking might feel very hard at first, but there is a lot that you can do to make it easier. Take these steps:  Talk to your family and friends. Ask them to support and encourage you.  Call phone quitlines, reach out to support groups, or work with a Veterinary surgeoncounselor.  Ask people who smoke to not smoke around you.  Avoid places that make you want (trigger) to smoke, such as: ? Bars. ? Parties. ? Smoke-break areas at work.  Spend time with people who do not smoke.  Lower the stress in your life. Stress can make you want to smoke. Try these things to help your stress: ? Getting regular exercise. ? Deep-breathing exercises. ? Yoga. ? Meditating. ? Doing a body scan. To do this, close your eyes, focus on one area of your body at a time from head to toe, and notice which parts of your body are tense. Try to relax the muscles  in those areas.  Download or buy apps on your mobile phone or tablet that can help you stick to your quit plan. There are many free apps, such as QuitGuide from the [employer] Office manager for Disease Control and Prevention). You can find more support from smokefree.gov and other websites.  This information is not intended to replace advice given to you by your health care provider. Make sure you discuss any questions you have with your health care provider. Document Released: 10/02/2009 Document Revised: 08/03/2016 Document Reviewed: 04/22/2015 Elsevier Interactive Patient Education  2018 [employer].

## 2018-06-16 NOTE — Progress Notes (Signed)
Assessment & Plan:  [name] was seen today for establish care.  Diagnoses and all orders for this visit:  Essential hypertension -     Lipid panel -     Basic Metabolic Panel Continue all antihypertensives as prescribed.  Remember to bring in your blood pressure log with you for your follow up appointment.  DASH/Mediterranean Diets are healthier choices for HTN.   Tobacco dependence due to cigarettes [name] was counseled on the dangers of tobacco use, and was advised to quit. Reviewed strategies to maximize success, including removing cigarettes and smoking materials from environment, stress management and support of family/friends as well as pharmacological alternatives including: Wellbutrin, Chantix, Nicotine patch, Nicotine gum or lozenges. Smoking cessation support: smoking cessation hotline: 1-800-QUIT-NOW.  Smoking cessation classes are also available through [facility]. Call [phone] or visit our website at [email or web address].   A total of 3 minutes was spent on counseling for smoking cessation and [name] is not ready to quit.     Patient has been counseled on age-appropriate routine health concerns for screening and prevention. These are reviewed and up-to-date. Referrals have been placed accordingly. Immunizations are up-to-date or declined.    Subjective:   Chief Complaint  Patient presents with  . Establish Care    Pt. is here to establish care for hypertension.    HPI [name] 47 y.o. male presents to office today to establish care for HTN.   CHRONIC HYPERTENSION Disease Monitoring Blood pressure well controlled today on lisinopril 5mg  daily.  Blood pressure range: He does not check his BP at home  Chest pain: no   Dyspnea: no   Claudication: no  Medication compliance: yes  Medication Side Effects  Lightheadedness: no   Urinary frequency: no   Edema: no   Impotence: no  Preventitive Healthcare:  Exercise: no formal routine but  he does stay active  Diet Pattern: salt shaker not on table  Salt Restriction:  no  BP Readings from Last 3 Encounters:  06/16/18 134/74  05/18/18 124/62  05/04/18 (!) 158/84   Review of Systems  Constitutional: Negative for fever, malaise/fatigue and weight loss.  HENT: Negative.  Negative for nosebleeds.   Eyes: Negative.  Negative for blurred vision, double vision and photophobia.  Respiratory: Negative.  Negative for cough and shortness of breath.   Cardiovascular: Negative.  Negative for chest pain, palpitations and leg swelling.  Gastrointestinal: Negative.  Negative for heartburn, nausea and vomiting.  Musculoskeletal: Negative.  Negative for myalgias.  Neurological: Negative.  Negative for dizziness, focal weakness, seizures and headaches.  Psychiatric/Behavioral: Negative.  Negative for suicidal ideas.    Past Medical History:  Diagnosis Date  . Hypertension     History reviewed. No pertinent surgical history.  Family History  Problem Relation Age of Onset  . Cancer Mother   . Heart failure Father     Social History Reviewed with no changes to be made today.   Outpatient Medications Prior to Visit  Medication Sig Dispense Refill  . lisinopril (PRINIVIL,ZESTRIL) 5 MG tablet Take 1 tablet (5 mg total) by mouth daily. 90 tablet 1  . ibuprofen (ADVIL,MOTRIN) 800 MG tablet Take 1 tablet (800 mg total) by mouth 3 (three) times daily. (Patient not taking: Reported on 11/26/2017) 21 tablet 0  . meclizine (ANTIVERT) 25 MG tablet Take 1 tablet (25 mg total) by mouth 3 (three) times daily as needed for dizziness. (Patient not taking: Reported on 06/16/2018) 60 tablet 0  . hydrocortisone cream  0.5 % Apply 1 application topically 2 (two) times daily. (Patient not taking: Reported on 06/16/2018) 30 g 0  . promethazine (PHENERGAN) 25 MG tablet Take 1 tablet (25 mg total) by mouth every 6 (six) hours as needed for nausea or vomiting. (Patient not taking: Reported on 11/26/2017) 15  tablet 0   No facility-administered medications prior to visit.     No Known Allergies     Objective:    BP 134/74 (BP Location: Left Arm, Patient Position: Sitting, Cuff Size: Normal)   Pulse 61   Temp 98.4 F (36.9 C) (Oral)   Ht 5\' 5"  (1.651 m)   Wt 157 lb 9.6 oz (71.5 kg)   SpO2 97%   BMI 26.23 kg/m  Wt Readings from Last 3 Encounters:  06/16/18 157 lb 9.6 oz (71.5 kg)  05/04/18 160 lb 3.2 oz (72.7 kg)  04/23/18 160 lb (72.6 kg)    Physical Exam  Constitutional: He is oriented to person, place, and time. He appears well-developed and well-nourished. He is cooperative.  HENT:  Head: Normocephalic and atraumatic.  Eyes: EOM are normal.  Neck: Normal range of motion.  Cardiovascular: Normal rate, regular rhythm and normal heart sounds. Exam reveals no gallop and no friction rub.  No murmur heard. Pulmonary/Chest: Breath sounds normal. No tachypnea. No respiratory distress. He has no decreased breath sounds. He has no wheezes. He has no rhonchi. He has no rales. He exhibits no tenderness.  Abdominal: Soft. Bowel sounds are normal.  Musculoskeletal: Normal range of motion. He exhibits no edema.  Neurological: He is alert and oriented to person, place, and time. Coordination normal.  Skin: Skin is warm and dry.  Psychiatric: He has a normal mood and affect. His behavior is normal. Judgment and thought content normal.  Nursing note and vitals reviewed.        Patient has been counseled extensively about nutrition and exercise as well as the importance of adherence with medications and regular follow-up. The patient was given clear instructions to go to ER or return to medical center if symptoms don't improve, worsen or new problems develop. The patient verbalized understanding.   Follow-up: Return in about 4 months (around 10/16/2018) for HTN.   Claiborne Rigg, FNP-BC [facility] [place], [place] [phone]   06/16/2018, 9:55 AM

## 2018-06-17 LAB — BASIC METABOLIC PANEL
BUN/Creatinine Ratio: 12 (ref 9–20)
BUN: 11 mg/dL (ref 6–24)
CALCIUM: 9.4 mg/dL (ref 8.7–10.2)
CHLORIDE: 107 mmol/L — AB (ref 96–106)
CO2: 23 mmol/L (ref 20–29)
Creatinine, Ser: 0.92 mg/dL (ref 0.76–1.27)
GFR calc Af Amer: 113 mL/min/{1.73_m2} (ref >59)
GFR calc non Af Amer: 98 mL/min/{1.73_m2} (ref >59)
GLUCOSE: 88 mg/dL (ref 65–99)
POTASSIUM: 4.1 mmol/L (ref 3.5–5.2)
Sodium: 142 mmol/L (ref 134–144)

## 2018-06-17 LAB — LIPID PANEL
CHOL/HDL RATIO: 5.7 ratio — AB (ref 0.0–5.0)
Cholesterol, Total: 201 mg/dL — ABNORMAL HIGH (ref 100–199)
HDL: 35 mg/dL — ABNORMAL LOW (ref >39)
LDL CALC: 145 mg/dL — AB (ref 0–99)
Triglycerides: 107 mg/dL (ref 0–149)
VLDL Cholesterol Cal: 21 mg/dL (ref 5–40)

## 2018-06-18 ENCOUNTER — Other Ambulatory Visit: Payer: Self-pay | Admitting: Nurse Practitioner

## 2018-06-18 MED ORDER — ATORVASTATIN CALCIUM 20 MG PO TABS: 20.0000 mg | ORAL_TABLET | Freq: Every day | ORAL | 3 refills | Status: AC

## 2018-06-23 ENCOUNTER — Telehealth: Payer: Self-pay

## 2018-06-23 NOTE — Telephone Encounter (Signed)
-----   Message from Claiborne RiggZelda W Fleming, NP sent at 06/18/2018  1:51 PM EDT ----- Please call patient: Tests show increased cholesterol/lipid levels. Will need to start on statin or cholesterol/lipid lowering medication. Prescription has been sent to the pharmacy. Patient should work on a low fat, heart healthy diet and participate in regular aerobic exercise program to control as well by working out at least 150 minutes per week. No fried foods. No junk foods, sodas, sugary drinks, unhealthy snacking, or smoking.

## 2018-06-23 NOTE — Telephone Encounter (Signed)
CMA attempt to call patient to inform on lab results.  No answer and left a VM for patient to call back.  If patient call back, please inform:  Please call patient: Tests show increased cholesterol/lipid levels. Will need to start on statin or cholesterol/lipid lowering medication. Prescription has been sent to the pharmacy. Patient should work on a low fat, heart healthy diet and participate in regular aerobic exercise program to control as well by working out at least 150 minutes per week. No fried foods. No junk foods, sodas, sugary drinks, unhealthy snacking, or smoking.

## 2018-06-25 IMAGING — DX DG CHEST 2V
2 series · 2 of 2 positions shown · non-contrast
Comparison: 01/29/2014

CLINICAL DATA: 38-year-old male with dizziness, shortness of breath
and sweating since this morning. Symptoms have since improved.

EXAM:
CHEST  2 VIEW

[chest pa]
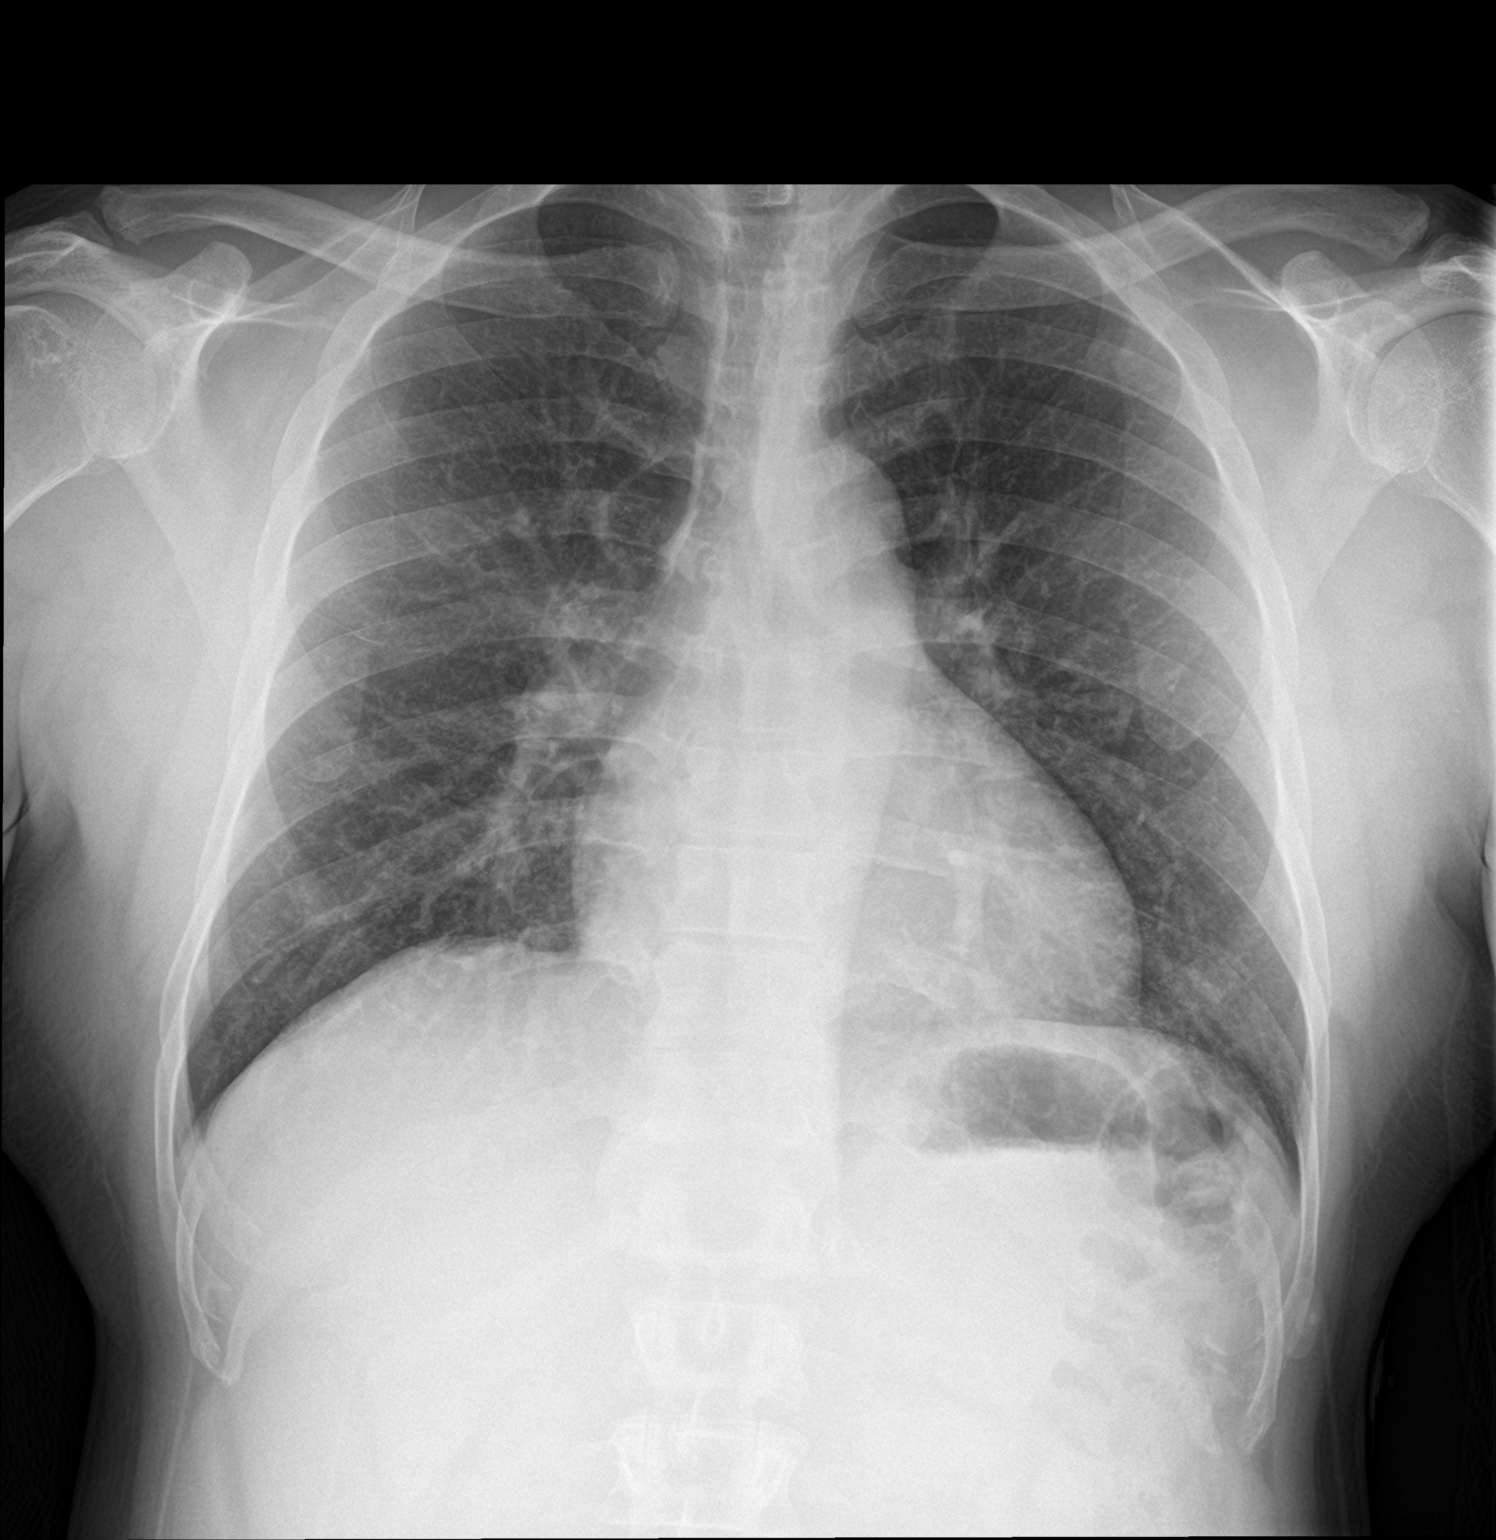

[chest lat]
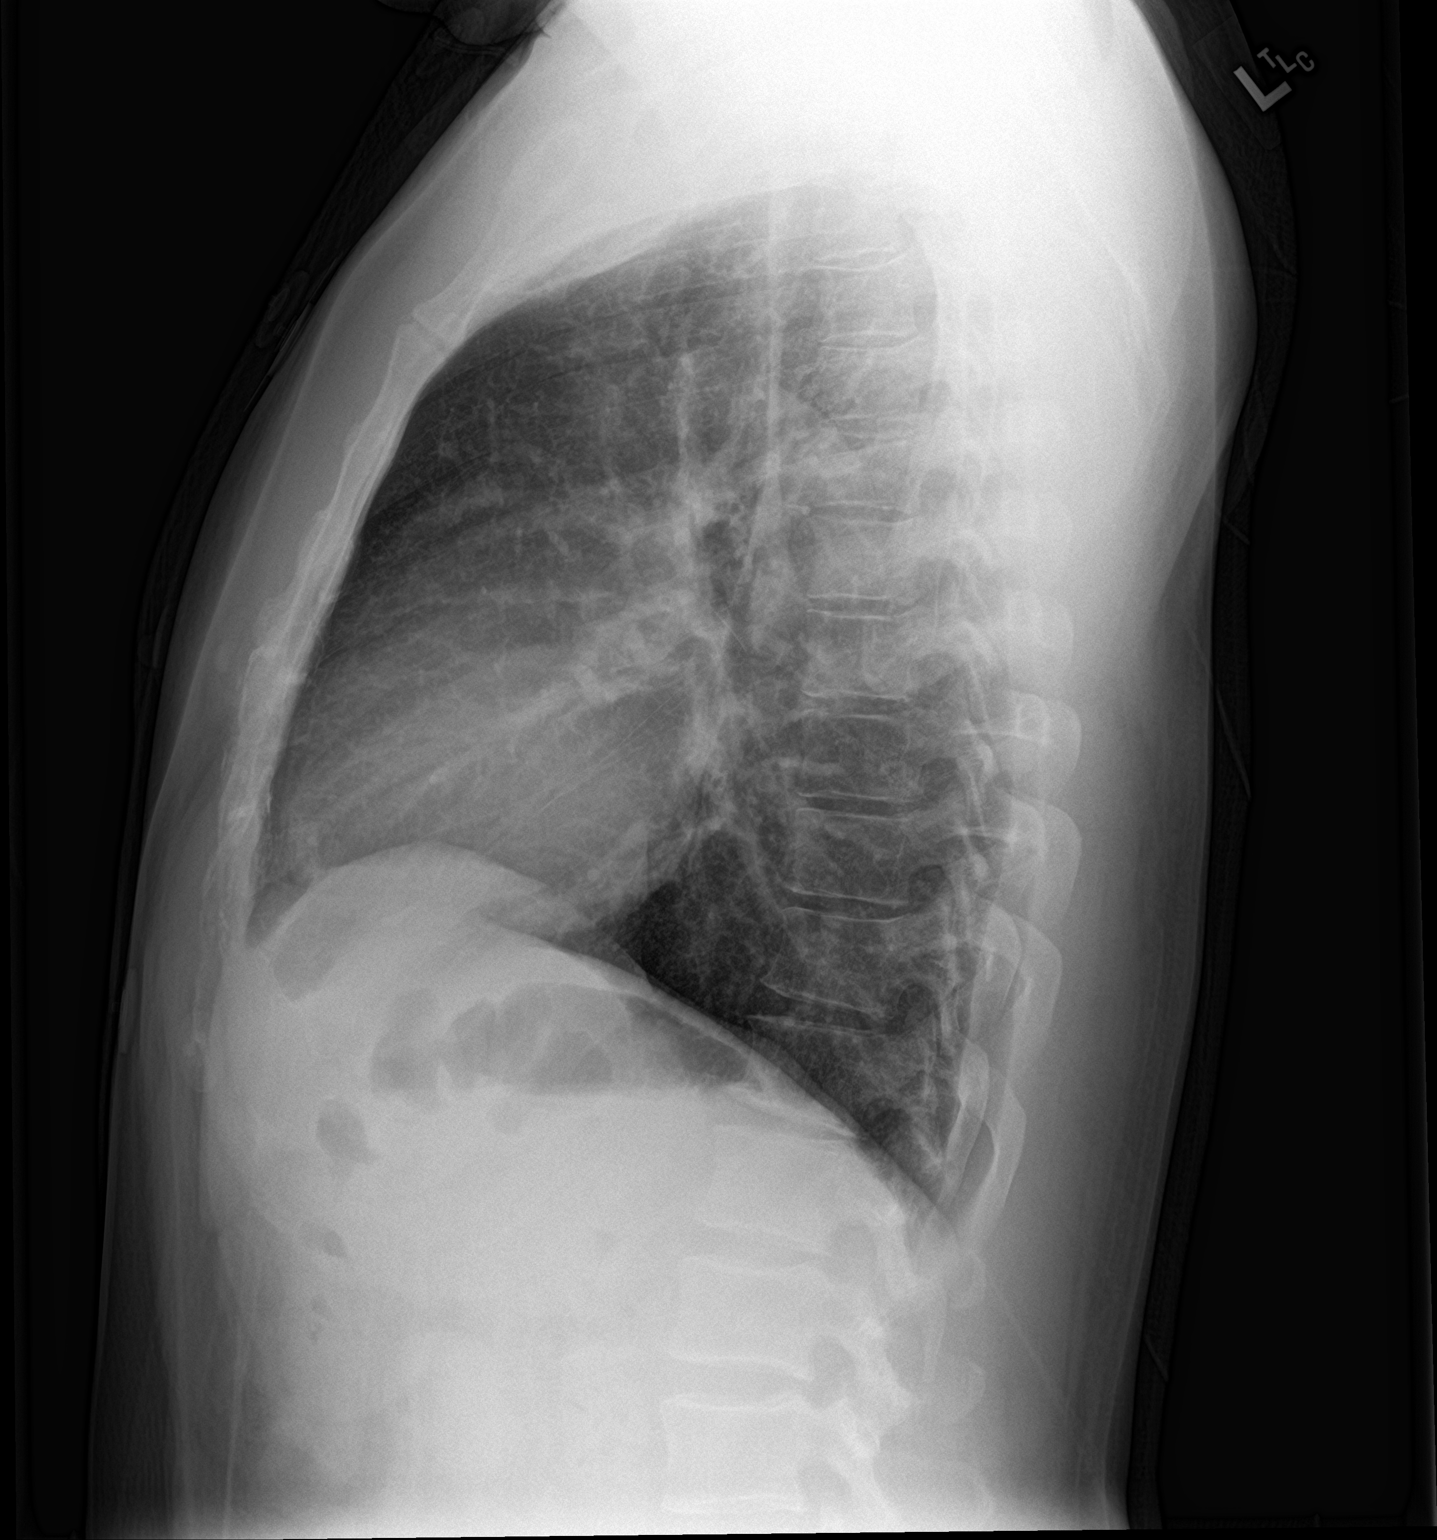

[2 of 2 positions shown; findings below may reference images not displayed]

FINDINGS: The heart size and mediastinal contours are within normal limits.
Both lungs are clear. The visualized skeletal structures are
unremarkable.
IMPRESSION: No active cardiopulmonary disease.

## 2019-09-17 ENCOUNTER — Other Ambulatory Visit (HOSPITAL_COMMUNITY)
Admission: RE | Admit: 2019-09-17 | Discharge: 2019-09-17 | Disposition: A | Discharge status: Home / Self Care | Payer: 59 | Source: Ambulatory Visit | Attending: Orthopedic Surgery | Admitting: Orthopedic Surgery

## 2019-09-17 ENCOUNTER — Other Ambulatory Visit: Payer: Self-pay | Admitting: Orthopedic Surgery

## 2019-09-17 DIAGNOSIS — Z20828 Contact with and (suspected) exposure to other viral communicable diseases: Secondary | ICD-10-CM | POA: Diagnosis not present

## 2019-09-17 DIAGNOSIS — M50223 Other cervical disc displacement at C6-C7 level: Secondary | ICD-10-CM | POA: Insufficient documentation

## 2019-09-17 DIAGNOSIS — G9520 Unspecified cord compression: Secondary | ICD-10-CM | POA: Diagnosis not present

## 2019-09-17 DIAGNOSIS — Z01812 Encounter for preprocedural laboratory examination: Secondary | ICD-10-CM | POA: Insufficient documentation

## 2019-09-18 LAB — NOVEL CORONAVIRUS, NAA (HOSP ORDER, SEND-OUT TO REF LAB; TAT 18-24 HRS): SARS-CoV-2, NAA: NOT DETECTED

## 2019-09-19 ENCOUNTER — Encounter (HOSPITAL_COMMUNITY): Payer: Self-pay | Admitting: *Deleted

## 2019-09-19 ENCOUNTER — Other Ambulatory Visit: Payer: Self-pay

## 2019-09-19 NOTE — Progress Notes (Signed)
Patient denies shortness of breath, fever, cough and chest pain.  PCP - Geryl Rankins, NP Cardiologist - Denies  Chest x-ray - Denies EKG - DOS 09/20/19 Stress Test - Denies ECHO - Denies Cardiac Cath -Denies   ERAS: Clears til 5:30 am DOS, no drink.  Anesthesia review: No  STOP now taking any Aspirin (unless otherwise instructed by your surgeon), Aleve, Naproxen, Ibuprofen, Motrin, Advil, Goody's, BC's, all herbal medications, fish oil, and all vitamins.   Coronavirus Screening Have you or your sister experienced the following symptoms:  Cough yes/no: No Fever (>100.25F)  yes/no: No Runny nose yes/no: No Sore throat yes/no: No Difficulty breathing/shortness of breath  yes/no: No  Have you or your sister traveled in the last 14 days and where? yes/no: No   Patient verbalized understanding of instructions that were given to him via phone.

## 2019-09-20 ENCOUNTER — Encounter (HOSPITAL_COMMUNITY): Payer: Self-pay

## 2019-09-20 ENCOUNTER — Observation Stay (HOSPITAL_COMMUNITY)
Admission: RE | Admit: 2019-09-20 | Discharge: 2019-09-21 | Disposition: A | Discharge status: Home / Self Care | Payer: PRIVATE HEALTH INSURANCE | Attending: Orthopedic Surgery | Admitting: Orthopedic Surgery

## 2019-09-20 ENCOUNTER — Ambulatory Visit (HOSPITAL_COMMUNITY): Payer: PRIVATE HEALTH INSURANCE | Admitting: Critical Care Medicine

## 2019-09-20 ENCOUNTER — Encounter (HOSPITAL_COMMUNITY)
Admission: RE | Disposition: A | Discharge status: Home / Self Care | Payer: Self-pay | Source: Home / Self Care | Attending: Orthopedic Surgery

## 2019-09-20 ENCOUNTER — Ambulatory Visit: Payer: Self-pay

## 2019-09-20 ENCOUNTER — Ambulatory Visit (HOSPITAL_COMMUNITY): Payer: PRIVATE HEALTH INSURANCE

## 2019-09-20 DIAGNOSIS — Z79899 Other long term (current) drug therapy: Secondary | ICD-10-CM | POA: Diagnosis not present

## 2019-09-20 DIAGNOSIS — E785 Hyperlipidemia, unspecified: Secondary | ICD-10-CM | POA: Insufficient documentation

## 2019-09-20 DIAGNOSIS — M50123 Cervical disc disorder at C6-C7 level with radiculopathy: Secondary | ICD-10-CM | POA: Diagnosis not present

## 2019-09-20 DIAGNOSIS — I10 Essential (primary) hypertension: Secondary | ICD-10-CM | POA: Diagnosis not present

## 2019-09-20 DIAGNOSIS — Z8249 Family history of ischemic heart disease and other diseases of the circulatory system: Secondary | ICD-10-CM | POA: Insufficient documentation

## 2019-09-20 DIAGNOSIS — M5410 Radiculopathy, site unspecified: Secondary | ICD-10-CM | POA: Diagnosis present

## 2019-09-20 DIAGNOSIS — R52 Pain, unspecified: Secondary | ICD-10-CM

## 2019-09-20 DIAGNOSIS — Z809 Family history of malignant neoplasm, unspecified: Secondary | ICD-10-CM | POA: Insufficient documentation

## 2019-09-20 DIAGNOSIS — F17210 Nicotine dependence, cigarettes, uncomplicated: Secondary | ICD-10-CM | POA: Diagnosis not present

## 2019-09-20 DIAGNOSIS — Z419 Encounter for procedure for purposes other than remedying health state, unspecified: Secondary | ICD-10-CM

## 2019-09-20 HISTORY — DX: Hyperlipidemia, unspecified: E78.5

## 2019-09-20 HISTORY — PX: ANTERIOR CERVICAL DECOMP/DISCECTOMY FUSION: SHX1161

## 2019-09-20 LAB — CBC WITH DIFFERENTIAL/PLATELET
Abs Immature Granulocytes: 0.02 10*3/uL (ref 0.00–0.07)
Basophils Absolute: 0.1 10*3/uL (ref 0.0–0.1)
Basophils Relative: 1 %
Eosinophils Absolute: 0.3 10*3/uL (ref 0.0–0.5)
Eosinophils Relative: 5 %
HCT: 45.3 % (ref 39.0–52.0)
Hemoglobin: 15 g/dL (ref 13.0–17.0)
Immature Granulocytes: 0 %
Lymphocytes Relative: 42 %
Lymphs Abs: 2.6 10*3/uL (ref 0.7–4.0)
MCH: 31.2 pg (ref 26.0–34.0)
MCHC: 33.1 g/dL (ref 30.0–36.0)
MCV: 94.2 fL (ref 80.0–100.0)
Monocytes Absolute: 0.6 10*3/uL (ref 0.1–1.0)
Monocytes Relative: 10 %
Neutro Abs: 2.6 10*3/uL (ref 1.7–7.7)
Neutrophils Relative %: 42 %
Platelets: 208 10*3/uL (ref 150–400)
RBC: 4.81 MIL/uL (ref 4.22–5.81)
RDW: 15.9 % — ABNORMAL HIGH (ref 11.5–15.5)
WBC: 6.1 10*3/uL (ref 4.0–10.5)
nRBC: 0 % (ref 0.0–0.2)

## 2019-09-20 LAB — COMPREHENSIVE METABOLIC PANEL
ALT: 42 U/L (ref 0–44)
AST: 30 U/L (ref 15–41)
Albumin: 3.6 g/dL (ref 3.5–5.0)
Alkaline Phosphatase: 60 U/L (ref 38–126)
Anion gap: 8 (ref 5–15)
BUN: 12 mg/dL (ref 6–20)
CO2: 22 mmol/L (ref 22–32)
Calcium: 9.2 mg/dL (ref 8.9–10.3)
Chloride: 107 mmol/L (ref 98–111)
Creatinine, Ser: 0.96 mg/dL (ref 0.61–1.24)
GFR calc Af Amer: >60 mL/min (ref >60)
GFR calc non Af Amer: >60 mL/min (ref >60)
Glucose, Bld: 98 mg/dL (ref 70–99)
Potassium: 4.1 mmol/L (ref 3.5–5.1)
Sodium: 137 mmol/L (ref 135–145)
Total Bilirubin: 0.5 mg/dL (ref 0.3–1.2)
Total Protein: 7.4 g/dL (ref 6.5–8.1)

## 2019-09-20 LAB — APTT: aPTT: 31 seconds (ref 24–36)

## 2019-09-20 LAB — PROTIME-INR
INR: 1 (ref 0.8–1.2)
Prothrombin Time: 12.7 seconds (ref 11.4–15.2)

## 2019-09-20 SURGERY — ANTERIOR CERVICAL DECOMPRESSION/DISCECTOMY FUSION 2 LEVELS
Anesthesia: General | Site: Spine Cervical

## 2019-09-20 MED ORDER — SODIUM CHLORIDE 0.9 % IV SOLN
INTRAVENOUS | Status: DC | PRN
  Administered 2019-09-20: 11:00:00 20 ug/min via INTRAVENOUS

## 2019-09-20 MED ORDER — DEXAMETHASONE SODIUM PHOSPHATE 10 MG/ML IJ SOLN
INTRAMUSCULAR | Status: DC | PRN
  Administered 2019-09-20: 10 mg via INTRAVENOUS

## 2019-09-20 MED ORDER — SENNOSIDES-DOCUSATE SODIUM 8.6-50 MG PO TABS: 1.0000 | ORAL_TABLET | Freq: Every evening | ORAL | Status: DC | PRN

## 2019-09-20 MED ORDER — PROMETHAZINE HCL 25 MG/ML IJ SOLN
INTRAMUSCULAR | Status: AC
  Filled 2019-09-20: qty 1

## 2019-09-20 MED ORDER — PROPOFOL 10 MG/ML IV BOLUS
INTRAVENOUS | Status: DC | PRN
  Administered 2019-09-20: 200 mg via INTRAVENOUS

## 2019-09-20 MED ORDER — DEXAMETHASONE SODIUM PHOSPHATE 10 MG/ML IJ SOLN
INTRAMUSCULAR | Status: AC
  Filled 2019-09-20: qty 1

## 2019-09-20 MED ORDER — THROMBIN 20000 UNITS EX SOLR
CUTANEOUS | Status: AC
  Filled 2019-09-20: qty 20000

## 2019-09-20 MED ORDER — MENTHOL 3 MG MT LOZG: 1.0000 | LOZENGE | OROMUCOSAL | Status: DC | PRN

## 2019-09-20 MED ORDER — PROPOFOL 10 MG/ML IV BOLUS
INTRAVENOUS | Status: AC
  Filled 2019-09-20: qty 20

## 2019-09-20 MED ORDER — DOCUSATE SODIUM 100 MG PO CAPS
100.0000 mg | ORAL_CAPSULE | Freq: Two times a day (BID) | ORAL | Status: DC
  Administered 2019-09-20: 21:00:00 100 mg via ORAL
  Filled 2019-09-20: qty 1

## 2019-09-20 MED ORDER — HYDROMORPHONE HCL 1 MG/ML IJ SOLN: 0.2500 mg | INTRAMUSCULAR | Status: DC | PRN

## 2019-09-20 MED ORDER — PANTOPRAZOLE SODIUM 40 MG PO TBEC
40.0000 mg | DELAYED_RELEASE_TABLET | Freq: Every day | ORAL | Status: DC
  Administered 2019-09-20: 21:00:00 40 mg via ORAL
  Filled 2019-09-20: qty 1

## 2019-09-20 MED ORDER — ONDANSETRON HCL 4 MG PO TABS: 4.0000 mg | ORAL_TABLET | Freq: Four times a day (QID) | ORAL | Status: DC | PRN

## 2019-09-20 MED ORDER — DIAZEPAM 5 MG PO TABS
5.0000 mg | ORAL_TABLET | Freq: Four times a day (QID) | ORAL | Status: DC | PRN
  Administered 2019-09-20: 17:00:00 5 mg via ORAL
  Filled 2019-09-20: qty 1

## 2019-09-20 MED ORDER — BUPIVACAINE-EPINEPHRINE 0.25% -1:200000 IJ SOLN
INTRAMUSCULAR | Status: DC | PRN
  Administered 2019-09-20: 3 mL

## 2019-09-20 MED ORDER — SODIUM CHLORIDE 0.9% FLUSH
3.0000 mL | Freq: Two times a day (BID) | INTRAVENOUS | Status: DC
  Administered 2019-09-20: 21:00:00 3 mL via INTRAVENOUS

## 2019-09-20 MED ORDER — ROCURONIUM BROMIDE 10 MG/ML (PF) SYRINGE
PREFILLED_SYRINGE | INTRAVENOUS | Status: AC
  Filled 2019-09-20: qty 10

## 2019-09-20 MED ORDER — OXYCODONE-ACETAMINOPHEN 5-325 MG PO TABS: 1.0000 | ORAL_TABLET | ORAL | 0 refills | Status: AC | PRN

## 2019-09-20 MED ORDER — CEFAZOLIN SODIUM-DEXTROSE 2-4 GM/100ML-% IV SOLN
2.0000 g | INTRAVENOUS | Status: AC
  Administered 2019-09-20: 11:00:00 2 g via INTRAVENOUS
  Filled 2019-09-20: qty 100

## 2019-09-20 MED ORDER — CEFAZOLIN SODIUM-DEXTROSE 2-4 GM/100ML-% IV SOLN
2.0000 g | Freq: Three times a day (TID) | INTRAVENOUS | Status: AC
  Administered 2019-09-20 – 2019-09-21 (×2): 2 g via INTRAVENOUS
  Filled 2019-09-20 (×2): qty 100

## 2019-09-20 MED ORDER — ALUM & MAG HYDROXIDE-SIMETH 200-200-20 MG/5ML PO SUSP: 30.0000 mL | Freq: Four times a day (QID) | ORAL | Status: DC | PRN

## 2019-09-20 MED ORDER — FENTANYL CITRATE (PF) 250 MCG/5ML IJ SOLN
INTRAMUSCULAR | Status: DC | PRN
  Administered 2019-09-20: 50 ug via INTRAVENOUS
  Administered 2019-09-20: 25 ug via INTRAVENOUS
  Administered 2019-09-20: 100 ug via INTRAVENOUS
  Administered 2019-09-20: 50 ug via INTRAVENOUS
  Administered 2019-09-20: 25 ug via INTRAVENOUS

## 2019-09-20 MED ORDER — MIDAZOLAM HCL 2 MG/2ML IJ SOLN: 0.5000 mg | Freq: Once | INTRAMUSCULAR | Status: DC | PRN

## 2019-09-20 MED ORDER — ACETAMINOPHEN 500 MG PO TABS
ORAL_TABLET | ORAL | Status: AC
  Administered 2019-09-20: 10:00:00 1000 mg via ORAL
  Filled 2019-09-20: qty 2

## 2019-09-20 MED ORDER — LISINOPRIL 10 MG PO TABS
5.0000 mg | ORAL_TABLET | Freq: Every day | ORAL | Status: DC
  Administered 2019-09-20: 17:00:00 5 mg via ORAL

## 2019-09-20 MED ORDER — ACETAMINOPHEN 500 MG PO TABS
1000.0000 mg | ORAL_TABLET | Freq: Once | ORAL | Status: AC
  Administered 2019-09-20: 10:00:00 1000 mg via ORAL

## 2019-09-20 MED ORDER — 0.9 % SODIUM CHLORIDE (POUR BTL) OPTIME
TOPICAL | Status: DC | PRN
  Administered 2019-09-20 (×2): 1000 mL

## 2019-09-20 MED ORDER — LIDOCAINE 2% (20 MG/ML) 5 ML SYRINGE
INTRAMUSCULAR | Status: DC | PRN
  Administered 2019-09-20 (×2): 40 mg via INTRAVENOUS

## 2019-09-20 MED ORDER — PHENOL 1.4 % MT LIQD: 1.0000 | OROMUCOSAL | Status: DC | PRN

## 2019-09-20 MED ORDER — ATORVASTATIN CALCIUM 10 MG PO TABS
20.0000 mg | ORAL_TABLET | Freq: Every day | ORAL | Status: DC
  Administered 2019-09-20: 20 mg via ORAL

## 2019-09-20 MED ORDER — DEXMEDETOMIDINE HCL 200 MCG/2ML IV SOLN
INTRAVENOUS | Status: DC | PRN
  Administered 2019-09-20 (×2): 8 ug via INTRAVENOUS

## 2019-09-20 MED ORDER — PROMETHAZINE HCL 25 MG/ML IJ SOLN
6.2500 mg | INTRAMUSCULAR | Status: DC | PRN
  Administered 2019-09-20: 14:00:00 6.25 mg via INTRAVENOUS

## 2019-09-20 MED ORDER — ROCURONIUM BROMIDE 10 MG/ML (PF) SYRINGE
PREFILLED_SYRINGE | INTRAVENOUS | Status: DC | PRN
  Administered 2019-09-20: 20 mg via INTRAVENOUS
  Administered 2019-09-20: 30 mg via INTRAVENOUS
  Administered 2019-09-20: 70 mg via INTRAVENOUS
  Administered 2019-09-20: 10 mg via INTRAVENOUS
  Administered 2019-09-20: 20 mg via INTRAVENOUS

## 2019-09-20 MED ORDER — DIAZEPAM 5 MG PO TABS: 5.0000 mg | ORAL_TABLET | Freq: Four times a day (QID) | ORAL | 0 refills | Status: AC | PRN

## 2019-09-20 MED ORDER — THROMBIN 20000 UNITS EX SOLR
CUTANEOUS | Status: DC | PRN
  Administered 2019-09-20: 20 mL via TOPICAL

## 2019-09-20 MED ORDER — LIDOCAINE 2% (20 MG/ML) 5 ML SYRINGE
INTRAMUSCULAR | Status: AC
  Filled 2019-09-20: qty 5

## 2019-09-20 MED ORDER — HEMOSTATIC AGENTS (NO CHARGE) OPTIME
TOPICAL | Status: DC | PRN
  Administered 2019-09-20: 1 via TOPICAL

## 2019-09-20 MED ORDER — LACTATED RINGERS IV SOLN
INTRAVENOUS | Status: DC
  Administered 2019-09-20 (×2): via INTRAVENOUS

## 2019-09-20 MED ORDER — ACETAMINOPHEN 325 MG PO TABS: 650.0000 mg | ORAL_TABLET | ORAL | Status: DC | PRN

## 2019-09-20 MED ORDER — OXYCODONE-ACETAMINOPHEN 5-325 MG PO TABS
1.0000 | ORAL_TABLET | ORAL | Status: DC | PRN
  Administered 2019-09-20 – 2019-09-21 (×4): 2 via ORAL
  Filled 2019-09-20 (×4): qty 2

## 2019-09-20 MED ORDER — ZOLPIDEM TARTRATE 5 MG PO TABS: 5.0000 mg | ORAL_TABLET | Freq: Every evening | ORAL | Status: DC | PRN

## 2019-09-20 MED ORDER — ACETAMINOPHEN 650 MG RE SUPP: 650.0000 mg | RECTAL | Status: DC | PRN

## 2019-09-20 MED ORDER — MIDAZOLAM HCL 2 MG/2ML IJ SOLN
INTRAMUSCULAR | Status: AC
  Filled 2019-09-20: qty 2

## 2019-09-20 MED ORDER — SODIUM CHLORIDE 0.9 % IV SOLN: 250.0000 mL | INTRAVENOUS | Status: DC

## 2019-09-20 MED ORDER — FENTANYL CITRATE (PF) 250 MCG/5ML IJ SOLN
INTRAMUSCULAR | Status: AC
  Filled 2019-09-20: qty 5

## 2019-09-20 MED ORDER — MIDAZOLAM HCL 5 MG/5ML IJ SOLN
INTRAMUSCULAR | Status: DC | PRN
  Administered 2019-09-20: 2 mg via INTRAVENOUS

## 2019-09-20 MED ORDER — SUGAMMADEX SODIUM 200 MG/2ML IV SOLN
INTRAVENOUS | Status: DC | PRN
  Administered 2019-09-20: 200 mg via INTRAVENOUS

## 2019-09-20 MED ORDER — BISACODYL 5 MG PO TBEC: 5.0000 mg | DELAYED_RELEASE_TABLET | Freq: Every day | ORAL | Status: DC | PRN

## 2019-09-20 MED ORDER — POVIDONE-IODINE 7.5 % EX SOLN
Freq: Once | CUTANEOUS | Status: DC
  Filled 2019-09-20: qty 118

## 2019-09-20 MED ORDER — FLEET ENEMA 7-19 GM/118ML RE ENEM: 1.0000 | ENEMA | Freq: Once | RECTAL | Status: DC | PRN

## 2019-09-20 MED ORDER — ONDANSETRON HCL 4 MG/2ML IJ SOLN
INTRAMUSCULAR | Status: AC
  Filled 2019-09-20: qty 2

## 2019-09-20 MED ORDER — ONDANSETRON HCL 4 MG/2ML IJ SOLN
INTRAMUSCULAR | Status: DC | PRN
  Administered 2019-09-20: 4 mg via INTRAVENOUS

## 2019-09-20 MED ORDER — ONDANSETRON HCL 4 MG/2ML IJ SOLN: 4.0000 mg | Freq: Four times a day (QID) | INTRAMUSCULAR | Status: DC | PRN

## 2019-09-20 MED ORDER — BUPIVACAINE-EPINEPHRINE (PF) 0.25% -1:200000 IJ SOLN
INTRAMUSCULAR | Status: AC
  Filled 2019-09-20: qty 30

## 2019-09-20 MED ORDER — MEPERIDINE HCL 25 MG/ML IJ SOLN: 6.2500 mg | INTRAMUSCULAR | Status: DC | PRN

## 2019-09-20 MED ORDER — SODIUM CHLORIDE 0.9% FLUSH: 3.0000 mL | INTRAVENOUS | Status: DC | PRN

## 2019-09-20 MED ORDER — PANTOPRAZOLE SODIUM 40 MG IV SOLR: 40.0000 mg | Freq: Every day | INTRAVENOUS | Status: DC

## 2019-09-20 MED ORDER — ESMOLOL HCL 100 MG/10ML IV SOLN
INTRAVENOUS | Status: DC | PRN
  Administered 2019-09-20: 40 mg via INTRAVENOUS
  Administered 2019-09-20: 30 mg via INTRAVENOUS

## 2019-09-20 SURGICAL SUPPLY — 81 items
BENZOIN TINCTURE PRP APPL 2/3 (GAUZE/BANDAGES/DRESSINGS) ×3 IMPLANT
BIT DRILL NEURO 2X3.1 SFT TUCH (MISCELLANEOUS) ×1 IMPLANT
BIT DRILL SRG 14X2.2XFLT CHK (BIT) ×1 IMPLANT
BIT DRL SRG 14X2.2XFLT CHK (BIT) ×1
BLADE CLIPPER SURG (BLADE) ×3 IMPLANT
BLADE SURG 15 STRL LF DISP TIS (BLADE) IMPLANT
BLADE SURG 15 STRL SS (BLADE)
BONE VIVIGEN FORMABLE 1.3CC (Bone Implant) ×3 IMPLANT
BUR MATCHSTICK NEURO 3.0 LAGG (BURR) IMPLANT
CARTRIDGE OIL MAESTRO DRILL (MISCELLANEOUS) ×1 IMPLANT
CLOSURE STERI-STRIP 1/2X4 (GAUZE/BANDAGES/DRESSINGS) ×1
CLOSURE WOUND 1/2 X4 (GAUZE/BANDAGES/DRESSINGS) ×1
CLSR STERI-STRIP ANTIMIC 1/2X4 (GAUZE/BANDAGES/DRESSINGS) ×2 IMPLANT
COLLAR CERV LO CONTOUR FIRM DE (SOFTGOODS) IMPLANT
CORD BIPOLAR FORCEPS 12FT (ELECTRODE) IMPLANT
COVER SURGICAL LIGHT HANDLE (MISCELLANEOUS) IMPLANT
COVER WAND RF STERILE (DRAPES) ×3 IMPLANT
DECANTER SPIKE VIAL GLASS SM (MISCELLANEOUS) ×3 IMPLANT
DIFFUSER DRILL AIR PNEUMATIC (MISCELLANEOUS) ×3 IMPLANT
DRAIN JACKSON RD 7FR 3/32 (WOUND CARE) IMPLANT
DRAPE C-ARM 42X72 X-RAY (DRAPES) ×6 IMPLANT
DRAPE POUCH INSTRU U-SHP 10X18 (DRAPES) IMPLANT
DRAPE SURG 17X23 STRL (DRAPES) ×12 IMPLANT
DRILL BIT SKYLINE 14MM (BIT) ×2
DRILL NEURO 2X3.1 SOFT TOUCH (MISCELLANEOUS) ×3
DURAPREP 26ML APPLICATOR (WOUND CARE) ×3 IMPLANT
ELECT COATED BLADE 2.86 ST (ELECTRODE) ×3 IMPLANT
ELECT REM PT RETURN 9FT ADLT (ELECTROSURGICAL) ×3
ELECTRODE REM PT RTRN 9FT ADLT (ELECTROSURGICAL) ×1 IMPLANT
EVACUATOR SILICONE 100CC (DRAIN) IMPLANT
GAUZE 4X4 16PLY RFD (DISPOSABLE) IMPLANT
GAUZE SPONGE 4X4 12PLY STRL (GAUZE/BANDAGES/DRESSINGS) IMPLANT
GAUZE SPONGE 4X4 12PLY STRL LF (GAUZE/BANDAGES/DRESSINGS) ×3 IMPLANT
GLOVE BIO SURGEON STRL SZ7 (GLOVE) ×3 IMPLANT
GLOVE BIO SURGEON STRL SZ8 (GLOVE) ×3 IMPLANT
GLOVE BIOGEL PI IND STRL 7.0 (GLOVE) ×2 IMPLANT
GLOVE BIOGEL PI IND STRL 7.5 (GLOVE) ×1 IMPLANT
GLOVE BIOGEL PI IND STRL 8 (GLOVE) ×1 IMPLANT
GLOVE BIOGEL PI INDICATOR 7.0 (GLOVE) ×4
GLOVE BIOGEL PI INDICATOR 7.5 (GLOVE) ×2
GLOVE BIOGEL PI INDICATOR 8 (GLOVE) ×2
GLOVE SURG SS PI 7.5 STRL IVOR (GLOVE) ×12 IMPLANT
GOWN STRL REUS W/ TWL LRG LVL3 (GOWN DISPOSABLE) ×2 IMPLANT
GOWN STRL REUS W/ TWL XL LVL3 (GOWN DISPOSABLE) ×2 IMPLANT
GOWN STRL REUS W/TWL LRG LVL3 (GOWN DISPOSABLE) ×4
GOWN STRL REUS W/TWL XL LVL3 (GOWN DISPOSABLE) ×4
INTERLOCK LRDTC CRVCL VBR 7MM (Bone Implant) ×1 IMPLANT
IV CATH 14GX2 1/4 (CATHETERS) ×3 IMPLANT
KIT BASIN OR (CUSTOM PROCEDURE TRAY) ×3 IMPLANT
KIT TURNOVER KIT B (KITS) ×3 IMPLANT
LORDOTIC CERVICAL VBR 7MM SM (Bone Implant) ×3 IMPLANT
MANIFOLD NEPTUNE II (INSTRUMENTS) IMPLANT
NEEDLE PRECISIONGLIDE 27X1.5 (NEEDLE) ×3 IMPLANT
NEEDLE SPNL 20GX3.5 QUINCKE YW (NEEDLE) ×3 IMPLANT
NS IRRIG 1000ML POUR BTL (IV SOLUTION) ×6 IMPLANT
OIL CARTRIDGE MAESTRO DRILL (MISCELLANEOUS) ×3
PACK LAMINECTOMY NEURO (CUSTOM PROCEDURE TRAY) ×6 IMPLANT
PAD ARMBOARD 7.5X6 YLW CONV (MISCELLANEOUS) ×6 IMPLANT
PATTIES SURGICAL .5 X.5 (GAUZE/BANDAGES/DRESSINGS) IMPLANT
PATTIES SURGICAL .5 X1 (DISPOSABLE) IMPLANT
PIN DISTRACTION 14 (PIN) ×6 IMPLANT
PLATE SKYLINE 12MM (Plate) ×3 IMPLANT
POSITIONER HEAD DONUT 9IN (MISCELLANEOUS) ×3 IMPLANT
SCREW SKYLINE VAR OS 14MM (Screw) ×12 IMPLANT
SPONGE INTESTINAL PEANUT (DISPOSABLE) ×9 IMPLANT
SPONGE SURGIFOAM ABS GEL 100 (HEMOSTASIS) ×3 IMPLANT
STRIP CLOSURE SKIN 1/2X4 (GAUZE/BANDAGES/DRESSINGS) ×2 IMPLANT
SURGIFLO W/THROMBIN 8M KIT (HEMOSTASIS) ×3 IMPLANT
SUT MNCRL AB 4-0 PS2 18 (SUTURE) ×3 IMPLANT
SUT SILK 4 0 (SUTURE)
SUT SILK 4-0 18XBRD TIE 12 (SUTURE) IMPLANT
SUT VIC AB 2-0 CT2 18 VCP726D (SUTURE) ×3 IMPLANT
SYR BULB IRRIGATION 50ML (SYRINGE) ×3 IMPLANT
SYR CONTROL 10ML LL (SYRINGE) ×9 IMPLANT
TAPE CLOTH 4X10 WHT NS (GAUZE/BANDAGES/DRESSINGS) IMPLANT
TAPE CLOTH SURG 4X10 WHT LF (GAUZE/BANDAGES/DRESSINGS) ×3 IMPLANT
TAPE UMBILICAL COTTON 1/8X30 (MISCELLANEOUS) ×3 IMPLANT
TOWEL GREEN STERILE (TOWEL DISPOSABLE) ×3 IMPLANT
TOWEL GREEN STERILE FF (TOWEL DISPOSABLE) ×3 IMPLANT
WATER STERILE IRR 1000ML POUR (IV SOLUTION) ×3 IMPLANT
YANKAUER SUCT BULB TIP NO VENT (SUCTIONS) ×3 IMPLANT

## 2019-09-20 NOTE — Anesthesia Preprocedure Evaluation (Addendum)
Anesthesia Evaluation  Patient identified by MRN, date of birth, ID band Patient awake    Reviewed: Allergy & Precautions, NPO status , Patient's Chart, lab work & pertinent test results  History of Anesthesia Complications Negative for: history of anesthetic complications  Airway Mallampati: II  TM Distance: >3 FB Neck ROM: Full    Dental  (+) Dental Advisory Given   Pulmonary Current Smoker and Patient abstained from smoking.,  09/17/2019 SARS coronavirus NEG   breath sounds clear to auscultation       Cardiovascular hypertension (does not take meds), (-) angina Rhythm:Regular Rate:Normal     Neuro/Psych B arm pain    GI/Hepatic negative GI ROS, Neg liver ROS,   Endo/Other  negative endocrine ROS  Renal/GU negative Renal ROS     Musculoskeletal   Abdominal   Peds  Hematology negative hematology ROS (+)   Anesthesia Other Findings   Reproductive/Obstetrics                            Anesthesia Physical Anesthesia Plan  ASA: II  Anesthesia Plan: General   Post-op Pain Management:    Induction: Intravenous  PONV Risk Score and Plan: 2 and Ondansetron, Dexamethasone and Treatment may vary due to age or medical condition  Airway Management Planned: Oral ETT  Additional Equipment:   Intra-op Plan:   Post-operative Plan: Extubation in OR  Informed Consent: I have reviewed the patients History and Physical, chart, labs and discussed the procedure including the risks, benefits and alternatives for the proposed anesthesia with the patient or authorized representative who has indicated his/her understanding and acceptance.     Dental advisory given  Plan Discussed with: CRNA and Surgeon  Anesthesia Plan Comments:         Anesthesia Quick Evaluation

## 2019-09-20 NOTE — Transfer of Care (Signed)
Immediate Anesthesia Transfer of Care Note  Patient: [name]  Procedure(s) Performed: ANTERIOR CERVICAL DECOMPRESSION FUSION CERVICAL SIX - CERVICAL SEVEN WITH INSTRUMENTATION AND ALLOGRAFT (N/A Spine Cervical)  Patient Location: PACU  Anesthesia Type:General  Level of Consciousness: drowsy  Airway & Oxygen Therapy: Patient Spontanous Breathing and Patient connected to face mask oxygen  Post-op Assessment: Report given to RN and Post -op Vital signs reviewed and stable  Post vital signs: Reviewed and stable  Last Vitals:  Vitals Value Taken Time  BP 155/87 09/20/19 1349  Temp    Pulse 78 09/20/19 1349  Resp 5 09/20/19 1348  SpO2 98 % 09/20/19 1349  Vitals shown include unvalidated device data.  Last Pain:  Vitals:   09/20/19 0916  TempSrc:   PainSc: 5       Patients Stated Pain Goal: 3 (89/21/19 4174)  Complications: No apparent anesthesia complications

## 2019-09-20 NOTE — Anesthesia Procedure Notes (Signed)
Procedure Name: Intubation Performed by: Milford Cage, CRNA Pre-anesthesia Checklist: Patient identified, Emergency Drugs available, Suction available and Patient being monitored Patient Re-evaluated:Patient Re-evaluated prior to induction Oxygen Delivery Method: Circle System Utilized Preoxygenation: Pre-oxygenation with 100% oxygen Induction Type: IV induction Ventilation: Oral airway inserted - appropriate to patient size and Mask ventilation with difficulty Laryngoscope Size: Glidescope and 4 Grade View: Grade II Tube type: Oral Tube size: 7.0 mm Number of attempts: 1 Airway Equipment and Method: Stylet and Oral airway Placement Confirmation: ETT inserted through vocal cords under direct vision,  positive ETCO2 and breath sounds checked- equal and bilateral Secured at: 22 cm Tube secured with: Tape Dental Injury: Teeth and Oropharynx as per pre-operative assessment  Difficulty Due To: Difficult Airway-  due to neck instability Comments: Would recommend Glidescope 3

## 2019-09-20 NOTE — Anesthesia Postprocedure Evaluation (Signed)
Anesthesia Post Note  Patient: [name]  Procedure(s) Performed: ANTERIOR CERVICAL DECOMPRESSION FUSION CERVICAL SIX - CERVICAL SEVEN WITH INSTRUMENTATION AND ALLOGRAFT (N/A Spine Cervical)     Patient location during evaluation: PACU Anesthesia Type: General Level of consciousness: awake and alert, patient cooperative and oriented Pain management: pain level controlled Vital Signs Assessment: post-procedure vital signs reviewed and stable Respiratory status: spontaneous breathing, nonlabored ventilation and respiratory function stable Cardiovascular status: blood pressure returned to baseline and stable Postop Assessment: no apparent nausea or vomiting Anesthetic complications: no    Last Vitals:  Vitals:   09/20/19 1528 09/20/19 1551  BP:  (!) 198/88  Pulse: 60 (!) 57  Resp: 17 16  Temp: (!) 36.2 C 36.8 C  SpO2: 95% 97%    Last Pain:  Vitals:   09/20/19 1551  TempSrc: Oral  PainSc:                  Rhyland Hinderliter,E. Quinta Eimer

## 2019-09-20 NOTE — Op Note (Signed)
PATIENT NAME: [name]   MEDICAL RECORD NO.:   [id number]    DATE OF BIRTH: 1971/07/30  DATE OF PROCEDURE: 09/20/2019                               OPERATIVE REPORT     PREOPERATIVE DIAGNOSES: 1. Left-sided cervical radiculopathy. 2. 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 Left-sided cervical radiculopathy. 2. 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 Complex anterior cervical decompression and fusion C6/7, with removal of multiple very large extruded disc fragments located lateral to, and behind, the spinal cord. 2. Placement of anterior instrumentation, C6/7. 3. Insertion of interbody device x1 (31mm Titan intervertebral spacer). 4. Intraoperative use of fluoroscopy. 5. Use of morselized allograft - ViviGen.   SURGEON:  Estill Bamberg, MD   ASSISTANT:  Jason Coop, PA-C.   ANESTHESIA:  General endotracheal anesthesia.   COMPLICATIONS:  None.   DISPOSITION:  Stable.   ESTIMATED BLOOD LOSS:  Minimal.   INDICATIONS FOR SURGERY:  Briefly, Mr. Odland  is a pleasant 48 -year- old male, who did present to me with severe pain in his neck and left arm.   The patient's MRI did reveal the very prominent findings noted above.  Given the ongoing rather debilitating pain and MRI findings, we did discuss proceeding with the procedure noted above.  The patient was fully aware of the risks and limitations of surgery as outlined in my preoperative note. The patient was made aware that a corpectomy may be needed to adequately and safely remove his herniated disc fragment.   OPERATIVE DETAILS:  On 09/20/2019 , the patient was brought to surgery and general endotracheal anesthesia was administered.  The patient was placed supine on the hospital bed. The neck was gently extended.  All bony prominences were meticulously  padded.  The neck was prepped and draped in the usual sterile fashion.  At this point, I did make a left-sided transverse incision.  The platysma was incised.  A Smith-Robinson approach was used and the anterior spine was identified. A self-retaining retractor was placed.  I then subperiosteally exposed the vertebral bodies from C6-C7.  Caspar pins were then placed into the C6 and C7 vertebral bodies and distraction was applied.  A thorough and complete C6-7 intervertebral diskectomy was performed. Disc material was clearly noted to be migrated behind the spinal cord toward the left. I did meticulously use a nerve hook to free up multiple very large disc fragments. 5 large disc fragments were removed, located within the spinal canal and foramen. This was an extremely meticulous portion of the surgery, as the fragments were large, and I did take great care was made to ensure that there was no undue manipulation or pressure on the spinal cord or C7 nerve root. I did remove every fragment I was able to encounter. I did question whether there may have been more fragments posteriorly, however, I did feel that additional dissection or exploration of the spinal canal would result in undue risk of nerve injury or spinal cord injury, however, I did feel that the the spinal canal and foramen was.  The endplates were then prepared and the appropriate-sized intervertebral spacer was then packed with ViviGen and tamped into position in the usual fashion. The Caspar pins  then were  removed and bone wax was placed in their place.  The appropriate-sized anterior cervical plate was placed over the anterior spine.  14 mm variable angle screws were placed, 2 in each vertebral body from C6-C7 for a total of 4 vertebral body screws.  The screws were then locked to the plate using the Cam locking mechanism.  I was very pleased with the final fluoroscopic images.  The wound was then irrigated.  The wound was then explored  for any undue bleeding and there was no bleeding noted. The wound was then closed in layers using 2-0 Vicryl, followed by 4-0 Monocryl.  Benzoin and Steri-Strips were applied, followed by sterile dressing.  All instrument counts were correct at the termination of the Procedure. If additional disc material was more posterior, beyond where  I would have been able to safely access them, a posterior decompression would be required, but overall I do not feel that this would be likely.   Of note, Pricilla Holm, PA-C, was my assistant throughout surgery, and did aid in retraction, suctioning, and closure from start to finish.         Phylliss Bob, MD

## 2019-09-20 NOTE — Progress Notes (Signed)
Orthopedic Tech Progress Note Patient Details:  [name] Nov 25, 1971 [id number] RN called requesting philly collar Ortho Devices Type of Ortho Device: Philadelphia cervical collar Ortho Device/Splint Interventions: Other (comment)   Post Interventions Patient Tolerated: Other (comment) Instructions Provided: Other (comment)   [name] 09/20/2019, 6:52 PM

## 2019-09-20 NOTE — H&P (Addendum)
PREOPERATIVE H&P  Chief Complaint: Neck pain, left arm pain  HPI: [name] is a 48 y.o. male who presents with ongoing pain in the left arm and neck  MRI reveals a very large cervical disc herniation, migrated around and behind spinal cord at C6/7  Patient has failed multiple forms of conservative care and continues to have pain (see office notes for additional details regarding the patient's full course of treatment)  Past Medical History:  Diagnosis Date  . Hyperlipidemia    no meds, diet and exercise   . Hypertension    no meds   History reviewed. No pertinent surgical history. Social History   Socioeconomic History  . Marital status: Single    Spouse name: Not on file  . Number of children: Not on file  . Years of education: Not on file  . Highest education level: Not on file  Occupational History  . Not on file  Social Needs  . Financial resource strain: Not on file  . Food insecurity    Worry: Not on file    Inability: Not on file  . Transportation needs    Medical: Not on file    Non-medical: Not on file  Tobacco Use  . Smoking status: Current Every Day Smoker    Packs/day: 0.50    Years: 10.00    Pack years: 5.00    Types: Cigarettes  . Smokeless tobacco: Never Used  Substance and Sexual Activity  . Alcohol use: No  . Drug use: Yes    Types: Marijuana    Comment: Last usewas on week of   09/10/19  . Sexual activity: Yes  Lifestyle  . Physical activity    Days per week: Not on file    Minutes per session: Not on file  . Stress: Not on file  Relationships  . Social Herbalist on phone: Not on file    Gets together: Not on file    Attends religious service: Not on file    Active member of club or organization: Not on file    Attends meetings of clubs or organizations: Not on file    Relationship status: Not on file  Other Topics Concern  . Not on file  Social History Narrative  . Not on file   Family History  Problem  Relation Age of Onset  . Cancer Mother   . Heart failure Father    No Known Allergies Prior to Admission medications   Medication Sig Start Date End Date Taking? Authorizing Provider  acetaminophen (TYLENOL) 500 MG tablet Take 1,000 mg by mouth every 6 (six) hours as needed for moderate pain or headache.   Yes [provider]  Benzocaine-Menthol-Methyl Sal (DENDRACIN EX) Apply 1 application topically daily as needed (pain).   Yes [provider]  atorvastatin (LIPITOR) 20 MG tablet Take 1 tablet (20 mg total) by mouth daily. Patient not taking: Reported on 09/18/2019 06/18/18   Gildardo Pounds, NP  lisinopril (PRINIVIL,ZESTRIL) 5 MG tablet Take 1 tablet (5 mg total) by mouth daily. Patient not taking: Reported on 09/18/2019 05/04/18   Brayton Caves, PA-C  meclizine (ANTIVERT) 25 MG tablet Take 1 tablet (25 mg total) by mouth 3 (three) times daily as needed for dizziness. Patient not taking: Reported on 06/16/2018 04/23/18   Hayden Rasmussen, MD     All other systems have been reviewed and were otherwise negative with the exception of those mentioned in the HPI  and as above.  Physical Exam: There were no vitals filed for this visit.  Body mass index is 24.21 kg/m.  General: Alert, no acute distress Cardiovascular: No pedal edema Respiratory: No cyanosis, no use of accessory musculature Skin: No lesions in the area of chief complaint Neurologic: Sensation intact distally Psychiatric: Patient is competent for consent with normal mood and affect Lymphatic: No axillary or cervical lymphadenopathy   Assessment/Plan: NECK PAIN, ONGOING LEFT ARM PAIN NOTABLE FOR A LARGE CERVICAL 6 - CERVICAL 7 DISC HERNIATION, MIGRATED TO THE LEFT OF AND BEGIND THE SPINAL CORD, CAUSING SEVERE CERVICAL 6 - CERVICAL 7 NEUROFORAMINAL COMPRESSION AND SPINAL CORD COMPRESSION.  Plan for Procedure(s): ANTERIOR CERVICAL DECOMPRESSION FUSION CERVICAL 6 - CERVICAL 7 WITH INSTRUMENTATION AND  ALLOGRAFT  OF NOTE, I DID EXPRESS TO THE PATIENT THAT IF HIS DISC HERNIATION WAS ABLE TO BE SAFELY REMOVED THROUGH THE C6/7 INTERVERTEBRAL SPACE, A CORPECTOMY AT C6 MAY BE NEEDED, AND HIS FUSION MAY NEED TO BE EXTENDED TO THE C5/6 LEVEL. HE DID EXPRESS AN UNDERSTANDING OF THIS POSSIBILITY.    Jackelyn Hoehn, MD 09/20/2019 7:23 AM

## 2019-09-21 ENCOUNTER — Encounter (HOSPITAL_COMMUNITY): Payer: Self-pay | Admitting: Orthopedic Surgery

## 2019-09-21 DIAGNOSIS — M50123 Cervical disc disorder at C6-C7 level with radiculopathy: Secondary | ICD-10-CM | POA: Diagnosis not present

## 2019-09-21 NOTE — Progress Notes (Signed)
    Patient doing well  Denies arm pain - patient very pleased about this Tolerating PO well   Physical Exam: Vitals:   09/20/19 2309 09/21/19 0309  BP: (!) 163/80 (!) 154/78  Pulse: 60 (!) 58  Resp: 20 18  Temp: 98.1 F (36.7 C) 97.9 F (36.6 C)  SpO2: 98% 98%    Neck soft/supple - patient looks comfortable Dressing in place NVI  POD #1 s/p ACDF, doing well  - encourage ambulation - Percocet for pain, Valium for muscle spasms - d/c home today with f/u in 2 weeks

## 2019-09-21 NOTE — Progress Notes (Signed)
Patient alert and oriented, mae's well, voiding adequate amount of urine, swallowing without difficulty, no c/o pain at time of discharge. Patient discharged home with family. Script and discharged instructions given to patient. Patient and family stated understanding of instructions given. Patient has an appointment with Dr. Dumonski 

## 2019-09-26 NOTE — Discharge Summary (Signed)
Patient ID: [name] MRN: [id number] DOB/AGE: [birth year] 48 y.o.  Admit date: 09/20/2019 Discharge date: 09/21/2019  Admission Diagnoses:  Active Problems:   Radiculopathy   Discharge Diagnoses:  Same  Past Medical History:  Diagnosis Date  . Hyperlipidemia    no meds, diet and exercise   . Hypertension    no meds    Surgeries: Procedure(s): ANTERIOR CERVICAL DECOMPRESSION FUSION CERVICAL SIX - CERVICAL SEVEN WITH INSTRUMENTATION AND ALLOGRAFT on 09/20/2019   Consultants: None  Discharged Condition: Improved  Hospital Course: Darryn Kydd is an 48 y.o. male who was admitted 09/20/2019 for operative treatment of radiculopathy. Patient has severe unremitting pain that affects sleep, daily activities, and work/hobbies. After pre-op clearance the patient was taken to the operating room on 09/20/2019 and underwent  Procedure(s): ANTERIOR CERVICAL DECOMPRESSION FUSION CERVICAL SIX - CERVICAL SEVEN WITH INSTRUMENTATION AND ALLOGRAFT.    Patient was given perioperative antibiotics:  Anti-infectives (From admission, onward)   Start     Dose/Rate Route Frequency Ordered Stop   09/20/19 1900  ceFAZolin (ANCEF) IVPB 2g/100 mL premix     2 g 200 mL/hr over 30 Minutes Intravenous Every 8 hours 09/20/19 1600 09/21/19 0159   09/20/19 1100  ceFAZolin (ANCEF) IVPB 2g/100 mL premix     2 g 200 mL/hr over 30 Minutes Intravenous On call to O.R. 09/20/19 0855 09/20/19 1105       Patient was given sequential compression devices, early ambulation to prevent DVT.  Patient benefited maximally from hospital stay and there were no complications.    Recent vital signs: BP 130/64 (BP Location: Right Arm)   Pulse (!) 58   Temp (!) 97.5 F (36.4 C) (Oral)   Resp 18   Ht 5\' 6"  (1.676 m)   Wt 68 kg   SpO2 97%   BMI 24.21 kg/m    Discharge Medications:   Allergies as of 09/21/2019   No Known Allergies     Medication List    TAKE these medications   acetaminophen 500 MG tablet  Commonly known as: TYLENOL Take 1,000 mg by mouth every 6 (six) hours as needed for moderate pain or headache.   atorvastatin 20 MG tablet Commonly known as: LIPITOR Take 1 tablet (20 mg total) by mouth daily.   DENDRACIN EX Apply 1 application topically daily as needed (pain).   diazepam 5 MG tablet Commonly known as: VALIUM Take 1 tablet (5 mg total) by mouth every 6 (six) hours as needed for muscle spasms.   lisinopril 5 MG tablet Commonly known as: ZESTRIL Take 1 tablet (5 mg total) by mouth daily.   meclizine 25 MG tablet Commonly known as: ANTIVERT Take 1 tablet (25 mg total) by mouth 3 (three) times daily as needed for dizziness.   oxyCODONE-acetaminophen 5-325 MG tablet Commonly known as: PERCOCET/ROXICET Take 1-2 tablets by mouth every 4 (four) hours as needed for moderate pain or severe pain.       Diagnostic Studies: Dg Cervical Spine 2-3 Views  Result Date: 09/20/2019 CLINICAL DATA:  C6-C7 ACDF EXAM: DG C-ARM 1-60 MIN; CERVICAL SPINE - 2-3 VIEW FLUOROSCOPY TIME:  Fluoroscopy Time:  Not provided COMPARISON:  09/20/2019 MRI from [facility] FINDINGS: Detail is limited. There has been anterior fusion with interbody fusion device at C6-7. Alignment is normal. IMPRESSION: Status post anterior fusion C6-7. Electronically Signed   By: 11/20/2019 M.D.   On: 09/20/2019 13:26   Dg C-arm 1-60 Min  Result Date: 09/20/2019 CLINICAL DATA:  C6-C7  ACDF EXAM: DG C-ARM 1-60 MIN; CERVICAL SPINE - 2-3 VIEW FLUOROSCOPY TIME:  Fluoroscopy Time:  Not provided COMPARISON:  09/20/2019 MRI from [facility]: Detail is limited. There has been anterior fusion with interbody fusion device at C6-7. Alignment is normal. IMPRESSION: Status post anterior fusion C6-7. Electronically Signed   By: Nolon Nations M.D.   On: 09/20/2019 13:26    Disposition:    POD #1 s/p ACDF, doing well  - encourage ambulation - Percocet for pain, Valium for muscle spasms -Scripts for pain  sent to pharmacy electronically  -D/C instructions sheet printed and in chart -D/C today  -F/U in office 2 weeks   Signed: Lennie Muckle Alechia Lezama 09/26/2019, 12:21 PM

## 2020-04-18 IMAGING — RF DG C-ARM 1-60 MIN
1 series · 2 of 2 positions shown · non-contrast
Comparison: 09/20/2019 MRI from [REDACTED]

CLINICAL DATA: C6-C7 ACDF

EXAM:
DG C-ARM 1-60 MIN; CERVICAL SPINE - 2-3 VIEW
FLUOROSCOPY TIME:  Fluoroscopy Time:  Not provided

[Series 1: run · 2 of 2 slices shown]
[im 1/2]
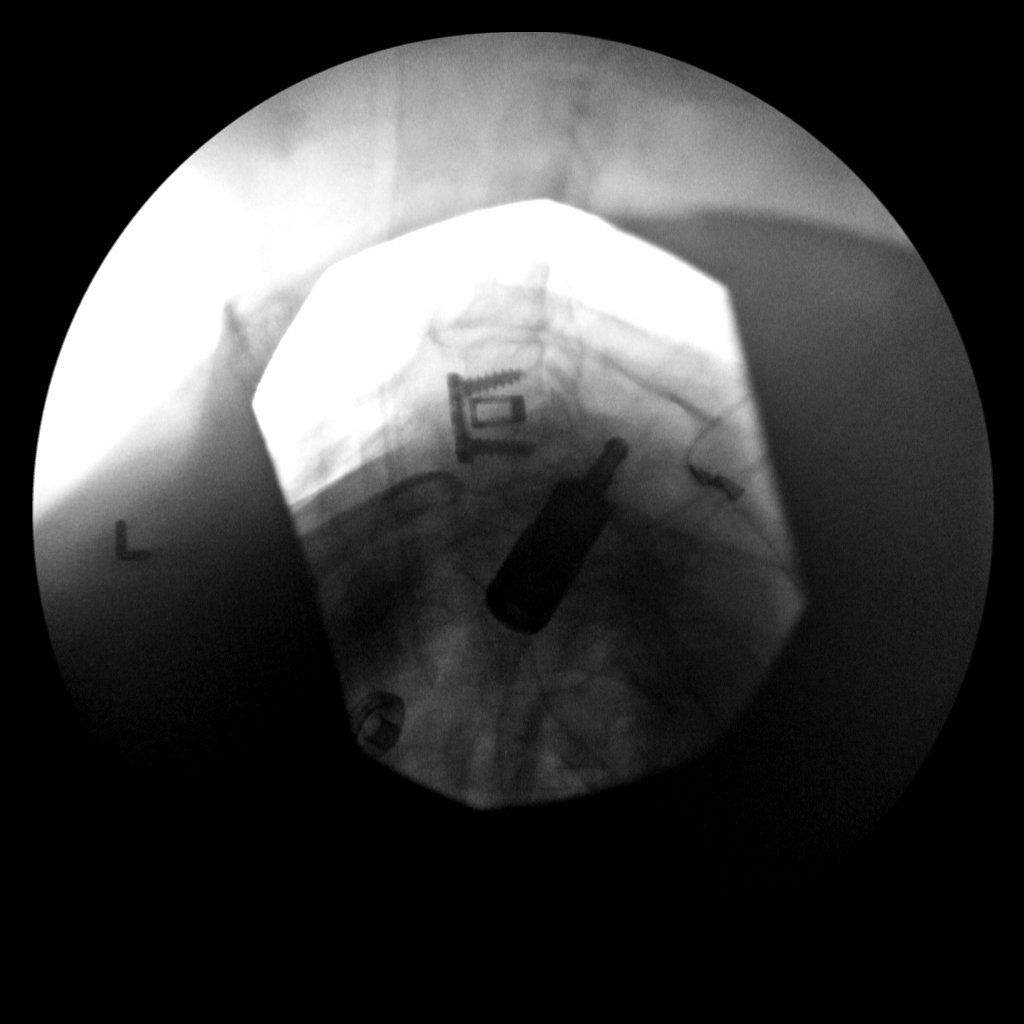
[im 2/2]
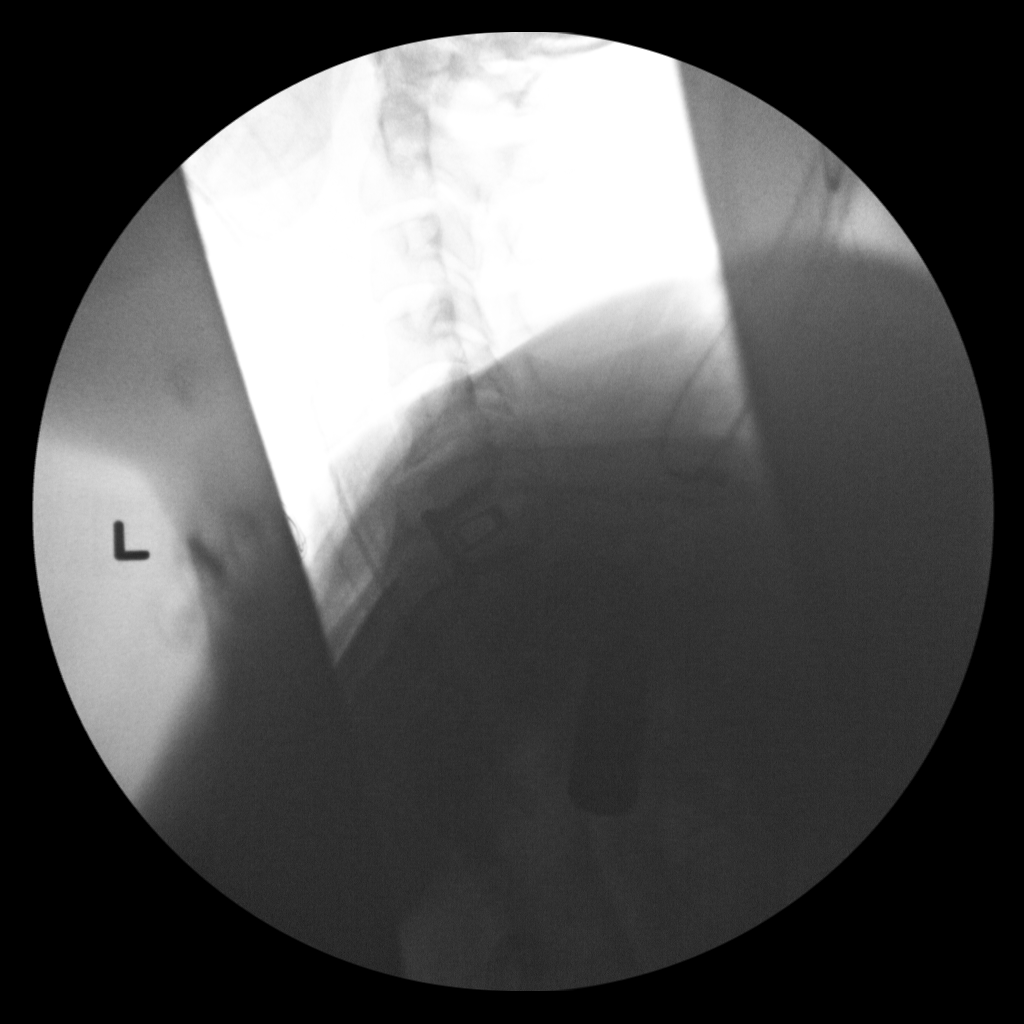

[2 of 2 positions shown; findings below may reference images not displayed]

FINDINGS: Detail is limited. There has been anterior fusion with interbody
fusion device at C6-7. Alignment is normal.
IMPRESSION: Status post anterior fusion C6-7.

## 2021-11-18 ENCOUNTER — Encounter (HOSPITAL_COMMUNITY): Payer: Self-pay | Admitting: Emergency Medicine

## 2021-11-18 ENCOUNTER — Ambulatory Visit (HOSPITAL_COMMUNITY)
Admission: EM | Admit: 2021-11-18 | Discharge: 2021-11-18 | Disposition: A | Discharge status: Home / Self Care | Payer: 59 | Attending: Family Medicine | Admitting: Family Medicine

## 2021-11-18 ENCOUNTER — Other Ambulatory Visit: Payer: Self-pay

## 2021-11-18 DIAGNOSIS — I160 Hypertensive urgency: Secondary | ICD-10-CM | POA: Diagnosis not present

## 2021-11-18 DIAGNOSIS — I10 Essential (primary) hypertension: Secondary | ICD-10-CM

## 2021-11-18 MED ORDER — AMLODIPINE BESYLATE 10 MG PO TABS: 10.0000 mg | ORAL_TABLET | Freq: Every day | ORAL | 0 refills | Status: AC

## 2021-11-18 NOTE — ED Triage Notes (Signed)
Pt presents with high blood pressure. States dentist was unable to remove tooth due to high blood pressure. Pt c/o light headedness earlier today. Pt states currently feeling anxious.

## 2021-11-18 NOTE — Discharge Instructions (Addendum)
Schedule appointment with a new primary care provider for management of hypertension. Start amlodipine (1/2 tablet) take for 3 days then increase and take the entire dose 10 mg (whole dose) You have been given a 60-day supply of medication take as prescribed.  If you develop any symptoms of chest pain, shortness of breath or changes in vision go immediately to the emergency department

## 2021-11-18 NOTE — ED Provider Notes (Signed)
[facility]    CSN: [id number] Arrival date & time: 11/18/21  1746      History   Chief Complaint Chief Complaint  Patient presents with   Hypertension   Anxiety    HPI [name] is a 50 y.o. male.   HPI Patient with a known history of hypertension and hyperlipidemia presents today concerned about her blood pressure.  On arrival blood pressure is 195/83.  Reports he has been off medications for some time.  Previously prescribed lisinopril for management of hypertension. Patient endorses feeling overly anxious .  Reports becoming lightheaded earlier today.  Patient is a chronic smoker. Patient reports he has not taken any medication and did not recall taking the previously prescribed lisinopril.  He denies any visual changes, any chest pain or shortness of breath.  He denies any swelling.  Reports he was only made aware of his high blood pressure today while he was at the dentist attempting to get a tooth extracted.  The procedure was discontinued due to blood pressures 200/100 with a wrist cuff and dental office. Past Medical History:  Diagnosis Date   Hyperlipidemia    no meds, diet and exercise    Hypertension    no meds    Patient Active Problem List   Diagnosis Date Noted   Radiculopathy 09/20/2019    Past Surgical History:  Procedure Laterality Date   ANTERIOR CERVICAL DECOMP/DISCECTOMY FUSION N/A 09/20/2019   Procedure: ANTERIOR CERVICAL DECOMPRESSION FUSION CERVICAL SIX - CERVICAL SEVEN WITH INSTRUMENTATION AND ALLOGRAFT;  Surgeon: Estill Bamberg, MD;  Location: [facility] OR;  Service: Orthopedics;  Laterality: N/A;       Home Medications    Prior to Admission medications   Medication Sig Start Date End Date Taking? Authorizing Provider  acetaminophen (TYLENOL) 500 MG tablet Take 1,000 mg by mouth every 6 (six) hours as needed for moderate pain or headache.    [provider]  amLODipine (NORVASC) 10 MG tablet Take 1 tablet (10 mg total) by  mouth daily. 11/18/21  Yes Bing Neighbors, FNP  atorvastatin (LIPITOR) 20 MG tablet Take 1 tablet (20 mg total) by mouth daily. Patient not taking: Reported on 09/18/2019 06/18/18   Claiborne Rigg, NP  Benzocaine-Menthol-Methyl Sal (DENDRACIN EX) Apply 1 application topically daily as needed (pain).    [provider]  diazepam (VALIUM) 5 MG tablet Take 1 tablet (5 mg total) by mouth every 6 (six) hours as needed for muscle spasms. 09/20/19   Estill Bamberg, MD  lisinopril (PRINIVIL,ZESTRIL) 5 MG tablet Take 1 tablet (5 mg total) by mouth daily. Patient not taking: Reported on 09/18/2019 05/04/18   Vivianne Master, PA-C  meclizine (ANTIVERT) 25 MG tablet Take 1 tablet (25 mg total) by mouth 3 (three) times daily as needed for dizziness. Patient not taking: Reported on 06/16/2018 04/23/18   Terrilee Files, MD  oxyCODONE-acetaminophen (PERCOCET/ROXICET) 5-325 MG tablet Take 1-2 tablets by mouth every 4 (four) hours as needed for moderate pain or severe pain. 09/20/19   Estill Bamberg, MD    Family History Family History  Problem Relation Age of Onset   Cancer Mother    Heart failure Father     Social History Social History   Tobacco Use   Smoking status: Every Day    Packs/day: 0.50    Years: 10.00    Pack years: 5.00    Types: Cigarettes   Smokeless tobacco: Never  Vaping Use   Vaping Use: Never used  Substance Use Topics   Alcohol use: No   Drug use: Yes    Types: Marijuana    Comment: Last usewas on week of   09/10/19     Allergies   Patient has no known allergies.   Review of Systems Review of Systems Pertinent negatives listed in HPI   Physical Exam Triage Vital Signs ED Triage Vitals  Enc Vitals Group     BP 11/18/21 1910 (!) 193/83     Pulse Rate 11/18/21 1910 87     Resp --      Temp 11/18/21 1910 99.6 F (37.6 C)     Temp Source 11/18/21 1910 Oral     SpO2 11/18/21 1910 100 %     Weight --      Height --      Head Circumference --      Peak  Flow --      Pain Score 11/18/21 1908 0     Pain Loc --      Pain Edu? --      Excl. in GC? --    No data found.  Updated Vital Signs BP (!) 193/83 (BP Location: Right Arm)   Pulse 87   Temp 99.6 F (37.6 C) (Oral)   SpO2 100%   Visual Acuity Right Eye Distance:   Left Eye Distance:   Bilateral Distance:    Right Eye Near:   Left Eye Near:    Bilateral Near:     Physical Exam Constitutional: Patient is cooperative,  appears obese, no distress. HENT: Normocephalic, atraumatic, External right and left ear normal. Oropharynx is clear and moist.  Eyes: Conjunctivae and EOM are normal. PERRLA, no scleral icterus. Neck: Normal ROM. Neck supple. No JVD. No tracheal deviation. No thyromegaly. CVS: RRR, S1/S2 +, no murmurs, no gallops, no carotid bruit.  Pulmonary: Effort and breath sounds normal, no stridor, rhonchi, wheezes, rales.  Abdominal: Soft. BS +, no distension, tenderness, rebound or guarding.  Musculoskeletal: Normal range of motion. No edema and no tenderness.  Neuro: Alert. Normal reflexes, muscle tone coordination. No cranial nerve deficit. Skin: Skin is warm and dry. No rash noted. Not diaphoretic. No erythema. No pallor. Psychiatric: Normal mood and affect. Behavior, judgment, thought content normal. UC Treatments / Results  Labs (all labs ordered are listed, but only abnormal results are displayed) Labs Reviewed - No data to display  EKG   Radiology No results found.  Procedures Procedures (including critical care time)  Medications Ordered in UC Medications - No data to display  Initial Impression / Assessment and Plan / UC Course  I have reviewed the triage vital signs and the nursing notes.  Pertinent labs & imaging results that were available during my care of the patient were reviewed by me and considered in my medical decision making (see chart for details).    Hypertensive urgency due to medication noncompliance.  Starting patient on  amlodipine given its rapid effect with rapidly lowering blood pressure given that patient's blood pressure is grossly uncontrolled.  Strict ER precautions given if he develops any red flag symptoms.  Patient's wife is accompanying him today during visit and they have a home cuff at home and she reports that she will monitor his blood pressure while taking the medication.  He has already secured an appointment with a primary care provider as he was advised that he will need monitoring to ensure that the medication is effective in lowering his blood pressure. Final Clinical Impressions(s) /  UC Diagnoses   Final diagnoses:  Hypertensive urgency     Discharge Instructions      Schedule appointment with a new primary care provider for management of hypertension. Start amlodipine (1/2 tablet) take for 3 days then increase and take the entire dose 10 mg (whole dose) You have been given a 60-day supply of medication take as prescribed.  If you develop any symptoms of chest pain, shortness of breath or changes in vision go immediately to the emergency department     ED Prescriptions     Medication Sig Dispense Auth. Provider   amLODipine (NORVASC) 10 MG tablet Take 1 tablet (10 mg total) by mouth daily. 60 tablet Bing Neighbors, FNP      PDMP not reviewed this encounter.   Bing Neighbors, [place] 11/24/21 [phone]

## 2022-03-16 ENCOUNTER — Other Ambulatory Visit: Payer: Self-pay | Admitting: Family Medicine

## 2022-04-02 ENCOUNTER — Other Ambulatory Visit: Payer: Self-pay | Admitting: Family Medicine

## 2022-04-12 ENCOUNTER — Ambulatory Visit: Payer: 59 | Admitting: Emergency Medicine
